# Patient Record
Sex: Female | Born: 1980 | Race: White | Hispanic: No | Marital: Single | Smoking: Current every day smoker
Health system: Southern US, Community
[De-identification: ages and names within clinical notes are randomized; demographics above are authoritative.]

## PROBLEM LIST (undated history)

## (undated) DIAGNOSIS — K219 Gastro-esophageal reflux disease without esophagitis: Secondary | ICD-10-CM

## (undated) DIAGNOSIS — O99350 Diseases of the nervous system complicating pregnancy, unspecified trimester: Secondary | ICD-10-CM

## (undated) DIAGNOSIS — G510 Bell's palsy: Secondary | ICD-10-CM

## (undated) DIAGNOSIS — F419 Anxiety disorder, unspecified: Secondary | ICD-10-CM

## (undated) HISTORY — PX: KNEE SURGERY: SHX244

## (undated) HISTORY — PX: TONSILLECTOMY: SUR1361

## (undated) HISTORY — PX: CHOLECYSTECTOMY: SHX55

---

## 2013-02-01 ENCOUNTER — Emergency Department (HOSPITAL_COMMUNITY)
Admission: EM | Admit: 2013-02-01 | Discharge: 2013-02-01 | Disposition: A | Discharge status: Home / Self Care | Payer: Medicaid Other | Attending: Emergency Medicine | Admitting: Emergency Medicine

## 2013-02-01 ENCOUNTER — Encounter (HOSPITAL_COMMUNITY): Payer: Self-pay | Admitting: *Deleted

## 2013-02-01 DIAGNOSIS — R209 Unspecified disturbances of skin sensation: Secondary | ICD-10-CM | POA: Insufficient documentation

## 2013-02-01 DIAGNOSIS — F419 Anxiety disorder, unspecified: Secondary | ICD-10-CM

## 2013-02-01 DIAGNOSIS — Z3202 Encounter for pregnancy test, result negative: Secondary | ICD-10-CM | POA: Insufficient documentation

## 2013-02-01 DIAGNOSIS — M79602 Pain in left arm: Secondary | ICD-10-CM

## 2013-02-01 DIAGNOSIS — F411 Generalized anxiety disorder: Secondary | ICD-10-CM | POA: Insufficient documentation

## 2013-02-01 DIAGNOSIS — M79605 Pain in left leg: Secondary | ICD-10-CM

## 2013-02-01 DIAGNOSIS — M79609 Pain in unspecified limb: Secondary | ICD-10-CM | POA: Insufficient documentation

## 2013-02-01 DIAGNOSIS — Z79899 Other long term (current) drug therapy: Secondary | ICD-10-CM | POA: Insufficient documentation

## 2013-02-01 DIAGNOSIS — R5381 Other malaise: Secondary | ICD-10-CM | POA: Insufficient documentation

## 2013-02-01 DIAGNOSIS — IMO0001 Reserved for inherently not codable concepts without codable children: Secondary | ICD-10-CM | POA: Insufficient documentation

## 2013-02-01 HISTORY — DX: Anxiety disorder, unspecified: F41.9

## 2013-02-01 LAB — POCT I-STAT, CHEM 8
Calcium, Ion: 1.22 mmol/L (ref 1.12–1.23)
Chloride: 106 mEq/L (ref 96–112)
HCT: 39 % (ref 36.0–46.0)
Potassium: 3.9 mEq/L (ref 3.5–5.1)
Sodium: 141 mEq/L (ref 135–145)

## 2013-02-01 LAB — PREGNANCY, URINE: Preg Test, Ur: NEGATIVE

## 2013-02-01 LAB — CK: Total CK: 79 U/L (ref 7–177)

## 2013-02-01 MED ORDER — CITALOPRAM HYDROBROMIDE 40 MG PO TABS: 40.0000 mg | ORAL_TABLET | Freq: Every day | ORAL | Status: DC

## 2013-02-01 MED ORDER — LORAZEPAM 1 MG PO TABS: 1.0000 mg | ORAL_TABLET | Freq: Three times a day (TID) | ORAL | Status: DC | PRN

## 2013-02-01 MED ORDER — LORAZEPAM 1 MG PO TABS
1.0000 mg | ORAL_TABLET | Freq: Once | ORAL | Status: AC
Start: 2013-02-01 — End: 2013-02-01
  Administered 2013-02-01: 1 mg via ORAL
  Filled 2013-02-01: qty 1

## 2013-02-01 NOTE — ED Notes (Signed)
Per report from [facility] pt states that 1300 she began to have L sided pain in her arm, leg, and torso.  No neuro deficits reported.  Pt is A/ox 3.  Resp symmetrical and unlabored.

## 2013-02-01 NOTE — ED Provider Notes (Signed)
History     CSN: [id number]  Arrival date & time 02/01/13  1402   First MD Initiated Contact with Patient 02/01/13 1407      Chief Complaint  Patient presents with  . Extremity Pain    Pt has generalized L sided pain in L arm, L leg, and L torso.    (Consider location/radiation/quality/duration/timing/severity/associated sxs/prior treatment) HPI Tracy Vance is a 32 y.o. female who presents to ED with complaint of left sided pain and "numb feeling."  States symptoms began while driving. States started in left arm, and now in left leg, left flank, left abdomen, left face. States "feels like my left side is about to fall asleep." States no hx of the same. No fever, chills malaise. NO chest pain. No SOB. No swelling of extremities. No injuries. No headache no neck pain. No fever. States does have hx of anxiety, ran out of celexa 2 wks ago. Not sure if pregnant.    Past Medical History  Diagnosis Date  . Anxiety     No past surgical history on file.  No family history on file.  History  Substance Use Topics  . Smoking status: Not on file  . Smokeless tobacco: Not on file  . Alcohol Use: Not on file    OB History   Grav Para Term Preterm Abortions TAB SAB Ect Mult Living                  Review of Systems  Constitutional: Negative for fever and chills.  HENT: Negative for neck pain and neck stiffness.   Respiratory: Negative.  Negative for chest tightness.   Cardiovascular: Negative.   Musculoskeletal: Positive for myalgias.  Skin: Negative.   Neurological: Positive for weakness and numbness. Negative for dizziness, light-headedness and headaches.    Allergies  Latex; Naproxen; and Toradol  Home Medications   Current Outpatient Rx  Name  Route  Sig  Dispense  Refill  . citalopram (CELEXA) 40 MG tablet   Oral   Take 40 mg by mouth daily.         Marland Kitchen ibuprofen (ADVIL,MOTRIN) 200 MG tablet   Oral   Take 800 mg by mouth every 6 (six) hours as needed for  pain.           BP 117/78  Pulse 84  Temp(Src) 98.9 F (37.2 C) (Oral)  Resp 14  SpO2 98%  Physical Exam  Nursing note and vitals reviewed. Constitutional: She is oriented to person, place, and time. She appears well-developed and well-nourished. No distress.  HENT:  Head: Normocephalic and atraumatic.  Eyes: Conjunctivae are normal.  Neck: Normal range of motion. Neck supple.  Cardiovascular: Normal rate, regular rhythm and normal heart sounds.   Pulmonary/Chest: Effort normal and breath sounds normal. No respiratory distress. She has no wheezes. She has no rales.  Abdominal: Soft. Bowel sounds are normal. She exhibits no distension. There is no tenderness. There is no rebound.  Musculoskeletal: Normal range of motion. She exhibits no edema and no tenderness.  Full rom of bilateral upper and lower extremitieis.   Neurological: She is alert and oriented to person, place, and time.  5/5 and equal upper and lower extremity strength bilaterally. Equal grip strength bilaterally. Normal finger to nose and heel to shin. No pronator drift.   Skin: Skin is warm and dry.  Psychiatric: She has a normal mood and affect. Her behavior is normal.    ED Course  Procedures (including critical care time)  Date: 02/01/2013  Rate: 83  Rhythm: normal sinus rhythm  QRS Axis: normal  Intervals: normal  ST/T Wave abnormalities: normal  Conduction Disutrbances:none  Narrative Interpretation:   Old EKG Reviewed: none available  Pt appears anxious. No exam findings. Full rom of all extremities. Pain with movement left arm and leg. Strength 5/5. No swelling. Doubt DVT. VS normal. Will get electrolytes, preg test, ecg.   Results for orders placed during the hospital encounter of 02/01/13  CK      Result Value Range   Total CK 79  7 - 177 U/L  PREGNANCY, URINE      Result Value Range   Preg Test, Ur NEGATIVE  NEGATIVE  POCT I-STAT, CHEM 8      Result Value Range   Sodium 141  135 - 145  mEq/L   Potassium 3.9  3.5 - 5.1 mEq/L   Chloride 106  96 - 112 mEq/L   BUN 16  6 - 23 mg/dL   Creatinine, Ser 1.32  0.50 - 1.10 mg/dL   Glucose, Bld 92  70 - 99 mg/dL   Calcium, Ion 4.40  1.02 - 1.23 mmol/L   TCO2 27  0 - 100 mmol/L   Hemoglobin 13.3  12.0 - 15.0 g/dL   HCT 72.5  36.6 - 44.0 %   No results found.    1. Left arm pain   2. Left leg pain   3. Anxiety       MDM  Pt with left sided pain. Pt came in very anxious. She has normal exam. Her symptoms have resolved. She is crying originally. After reassuring, pt states she is feeling much better. ATivan has helped her nerves and she is completely symptom free. Electrolytes negative.  CK obtained and is negative, obtained due to pain to r/o rhabdo. ECG normal. Possible panic attack as cause of pt's symptoms. Asked for celexa refill until she can find pcp. Will refill. Instructed to follow up closely.           Lottie Mussel, PA-C 02/02/13 1722

## 2013-02-06 NOTE — ED Provider Notes (Signed)
Medical screening examination/treatment/procedure(s) were performed by non-physician practitioner and as supervising physician I was immediately available for consultation/collaboration.   Rolan Bucco, MD 02/06/13 [phone]

## 2013-07-02 ENCOUNTER — Encounter (HOSPITAL_COMMUNITY): Payer: Self-pay | Admitting: Emergency Medicine

## 2013-07-02 ENCOUNTER — Emergency Department (HOSPITAL_COMMUNITY)
Admission: EM | Admit: 2013-07-02 | Discharge: 2013-07-02 | Disposition: A | Discharge status: Home / Self Care | Payer: Medicaid Other | Attending: Emergency Medicine | Admitting: Emergency Medicine

## 2013-07-02 DIAGNOSIS — K044 Acute apical periodontitis of pulpal origin: Secondary | ICD-10-CM | POA: Insufficient documentation

## 2013-07-02 DIAGNOSIS — F411 Generalized anxiety disorder: Secondary | ICD-10-CM | POA: Insufficient documentation

## 2013-07-02 DIAGNOSIS — K047 Periapical abscess without sinus: Secondary | ICD-10-CM

## 2013-07-02 DIAGNOSIS — Z91040 Latex allergy status: Secondary | ICD-10-CM | POA: Insufficient documentation

## 2013-07-02 DIAGNOSIS — K0889 Other specified disorders of teeth and supporting structures: Secondary | ICD-10-CM

## 2013-07-02 DIAGNOSIS — F17200 Nicotine dependence, unspecified, uncomplicated: Secondary | ICD-10-CM | POA: Insufficient documentation

## 2013-07-02 DIAGNOSIS — K089 Disorder of teeth and supporting structures, unspecified: Secondary | ICD-10-CM | POA: Insufficient documentation

## 2013-07-02 MED ORDER — HYDROCODONE-ACETAMINOPHEN 5-325 MG PO TABS: 1.0000 | ORAL_TABLET | ORAL | Status: DC | PRN

## 2013-07-02 MED ORDER — AMOXICILLIN 500 MG PO CAPS: 500.0000 mg | ORAL_CAPSULE | Freq: Three times a day (TID) | ORAL | Status: DC

## 2013-07-02 NOTE — ED Provider Notes (Signed)
CSN: [id number]     Arrival date & time 07/02/13  1242 History   First MD Initiated Contact with Patient 07/02/13 1247     Chief Complaint  Patient presents with  . Dental Pain   (Consider location/radiation/quality/duration/timing/severity/associated sxs/prior Treatment) HPI Comments: 32 year old female presents to the emergency department from the dentist office across the street after attempt for a tooth being pulled. Patient states the dentist was in the process of pulling her tooth, however she was not known in the dentist continue to try to pull the tooth with patient being in severe pain. Dentist then injected more numbing medicine, had patient sit for a little while, after 15 minutes the dentist not returning patient left very upset. Dentist's office was supposed to prescribe antibiotics for the infection of her tooth that was seen on x-ray taken earlier today before attempted extraction. Denies fever, chills, difficulty swallowing or breathing. This was the first time she had encountered this dentist.  Patient is a 33 y.o. female presenting with tooth pain. The history is provided by the patient.  Dental Pain   Past Medical History  Diagnosis Date  . Anxiety    Past Surgical History  Procedure Laterality Date  . Cesarean section    . Tonsillectomy    . Knee surgery     No family history on file. History  Substance Use Topics  . Smoking status: Current Every Day Smoker -- 1.00 packs/day for 15 years    Types: Cigarettes  . Smokeless tobacco: Never Used  . Alcohol Use: Yes     Comment: Occassionally    OB History   Grav Para Term Preterm Abortions TAB SAB Ect Mult Living                 Review of Systems  HENT: Positive for dental problem.   All other systems reviewed and are negative.    Allergies  Latex; Naproxen; and Toradol  Home Medications   Current Outpatient Rx  Name  Route  Sig  Dispense  Refill  . citalopram (CELEXA) 40 MG tablet   Oral   Take 1  tablet (40 mg total) by mouth daily.   30 tablet   1   . DiphenhydrAMINE HCl (BENADRYL ALLERGY PO)   Oral   Take 1 tablet by mouth daily as needed (allergies).         Marland Kitchen ibuprofen (ADVIL,MOTRIN) 200 MG tablet   Oral   Take 800 mg by mouth every 6 (six) hours as needed for pain.         Marland Kitchen amoxicillin (AMOXIL) 500 MG capsule   Oral   Take 1 capsule (500 mg total) by mouth 3 (three) times daily.   21 capsule   0   . HYDROcodone-acetaminophen (NORCO/VICODIN) 5-325 MG per tablet   Oral   Take 1-2 tablets by mouth every 4 (four) hours as needed for pain.   6 tablet   0    BP 159/99  Pulse 90  Temp(Src) 98.2 F (36.8 C) (Oral)  Resp 22  SpO2 98%  LMP 05/21/2013 Physical Exam  Nursing note and vitals reviewed. Constitutional: She is oriented to person, place, and time. She appears well-developed and well-nourished. No distress.  Tearful  HENT:  Head: Normocephalic and atraumatic.  Mouth/Throat: Oropharynx is clear and moist.    Eyes: Conjunctivae are normal.  Neck: Normal range of motion. Neck supple.  Cardiovascular: Normal rate, regular rhythm and normal heart sounds.   Pulmonary/Chest: Effort normal and  breath sounds normal.  Musculoskeletal: Normal range of motion. She exhibits no edema.  Lymphadenopathy:    She has no cervical adenopathy.  Neurological: She is alert and oriented to person, place, and time.  Skin: Skin is warm and dry. She is not diaphoretic.  Psychiatric: She has a normal mood and affect. Her behavior is normal.    ED Course  Procedures (including critical care time) Labs Review Labs Reviewed - No data to display Imaging Review No results found.  MDM   1. Pain, dental   2. Dental infection     Dental pain associated with dental infection. No evidence of dental abscess. Patient is afebrile, non toxic appearing and swallowing secretions well. I gave patient referral to dentist and stressed the importance of dental follow up for ultimate  management of dental pain. I will also give amoxicillin and pain control. Patient voices understanding and is agreeable to plan.   Trevor Mace, PA-C 07/02/13 1312

## 2013-07-02 NOTE — ED Provider Notes (Signed)
Medical screening examination/treatment/procedure(s) were conducted as a shared visit with non-physician practitioner(s) and myself.  I personally evaluated the patient during the encounter   Shandiin Eisenbeis, MD 07/02/13 1313 

## 2013-07-02 NOTE — ED Notes (Signed)
Pt reports coming from a dental office. Pt states the dentist was attempting to extract a left-anterior tooth, which has obvious decay. Pt states her face was numb due to local anesthetic, however the tooth was not numb and left the office due to unbearable pain. Pt is tearful in triage.

## 2013-08-22 ENCOUNTER — Emergency Department (HOSPITAL_COMMUNITY)
Admission: EM | Admit: 2013-08-22 | Discharge: 2013-08-23 | Disposition: A | Discharge status: Home / Self Care | Payer: Medicaid Other | Attending: Emergency Medicine | Admitting: Emergency Medicine

## 2013-08-22 ENCOUNTER — Emergency Department (HOSPITAL_COMMUNITY): Payer: Medicaid Other

## 2013-08-22 ENCOUNTER — Encounter (HOSPITAL_COMMUNITY): Payer: Self-pay | Admitting: Emergency Medicine

## 2013-08-22 DIAGNOSIS — Z91040 Latex allergy status: Secondary | ICD-10-CM | POA: Insufficient documentation

## 2013-08-22 DIAGNOSIS — N730 Acute parametritis and pelvic cellulitis: Secondary | ICD-10-CM | POA: Insufficient documentation

## 2013-08-22 DIAGNOSIS — Z3202 Encounter for pregnancy test, result negative: Secondary | ICD-10-CM | POA: Insufficient documentation

## 2013-08-22 DIAGNOSIS — Z792 Long term (current) use of antibiotics: Secondary | ICD-10-CM | POA: Insufficient documentation

## 2013-08-22 DIAGNOSIS — F17200 Nicotine dependence, unspecified, uncomplicated: Secondary | ICD-10-CM | POA: Insufficient documentation

## 2013-08-22 DIAGNOSIS — Z79899 Other long term (current) drug therapy: Secondary | ICD-10-CM | POA: Insufficient documentation

## 2013-08-22 DIAGNOSIS — R110 Nausea: Secondary | ICD-10-CM | POA: Insufficient documentation

## 2013-08-22 DIAGNOSIS — F411 Generalized anxiety disorder: Secondary | ICD-10-CM | POA: Insufficient documentation

## 2013-08-22 DIAGNOSIS — R42 Dizziness and giddiness: Secondary | ICD-10-CM | POA: Insufficient documentation

## 2013-08-22 LAB — URINALYSIS, ROUTINE W REFLEX MICROSCOPIC
Ketones, ur: NEGATIVE mg/dL
Leukocytes, UA: NEGATIVE
Nitrite: NEGATIVE
Protein, ur: NEGATIVE mg/dL
pH: 5.5 (ref 5.0–8.0)

## 2013-08-22 LAB — POCT I-STAT, CHEM 8
BUN: 17 mg/dL (ref 6–23)
Calcium, Ion: 1.17 mmol/L (ref 1.12–1.23)
Creatinine, Ser: 0.9 mg/dL (ref 0.50–1.10)
Glucose, Bld: 87 mg/dL (ref 70–99)
TCO2: 19 mmol/L (ref 0–100)

## 2013-08-22 LAB — CBC WITH DIFFERENTIAL/PLATELET
Basophils Absolute: 0.1 10*3/uL (ref 0.0–0.1)
Basophils Relative: 0 % (ref 0–1)
Hemoglobin: 14 g/dL (ref 12.0–15.0)
MCHC: 34.6 g/dL (ref 30.0–36.0)
Neutro Abs: 15.1 10*3/uL — ABNORMAL HIGH (ref 1.7–7.7)
Neutrophils Relative %: 74 % (ref 43–77)
RDW: 13.2 % (ref 11.5–15.5)
WBC: 20.4 10*3/uL — ABNORMAL HIGH (ref 4.0–10.5)

## 2013-08-22 LAB — WET PREP, GENITAL
Clue Cells Wet Prep HPF POC: NONE SEEN
Trich, Wet Prep: NONE SEEN

## 2013-08-22 MED ORDER — HYDROMORPHONE HCL PF 1 MG/ML IJ SOLN
1.0000 mg | Freq: Once | INTRAMUSCULAR | Status: AC
  Administered 2013-08-22: 1 mg via INTRAVENOUS
  Filled 2013-08-22: qty 1

## 2013-08-22 MED ORDER — ONDANSETRON HCL 4 MG PO TABS: 4.0000 mg | ORAL_TABLET | Freq: Four times a day (QID) | ORAL | Status: DC

## 2013-08-22 MED ORDER — CEFTRIAXONE SODIUM 250 MG IJ SOLR
250.0000 mg | Freq: Once | INTRAMUSCULAR | Status: AC
  Administered 2013-08-22: 250 mg via INTRAMUSCULAR
  Filled 2013-08-22: qty 250

## 2013-08-22 MED ORDER — LIDOCAINE HCL 1 % IJ SOLN
INTRAMUSCULAR | Status: AC
  Administered 2013-08-22: 0.9 mL
  Filled 2013-08-22: qty 20

## 2013-08-22 MED ORDER — IOHEXOL 300 MG/ML  SOLN
100.0000 mL | Freq: Once | INTRAMUSCULAR | Status: AC | PRN
  Administered 2013-08-22: 100 mL via INTRAVENOUS

## 2013-08-22 MED ORDER — ONDANSETRON HCL 4 MG/2ML IJ SOLN
4.0000 mg | Freq: Once | INTRAMUSCULAR | Status: AC
  Administered 2013-08-22: 4 mg via INTRAVENOUS
  Filled 2013-08-22: qty 2

## 2013-08-22 MED ORDER — IOHEXOL 300 MG/ML  SOLN
50.0000 mL | Freq: Once | INTRAMUSCULAR | Status: AC | PRN
  Administered 2013-08-22: 50 mL via ORAL

## 2013-08-22 MED ORDER — HYDROCODONE-ACETAMINOPHEN 5-325 MG PO TABS: 2.0000 | ORAL_TABLET | ORAL | Status: DC | PRN

## 2013-08-22 MED ORDER — SODIUM CHLORIDE 0.9 % IV BOLUS (SEPSIS)
1000.0000 mL | Freq: Once | INTRAVENOUS | Status: AC
  Administered 2013-08-22: 1000 mL via INTRAVENOUS

## 2013-08-22 MED ORDER — AZITHROMYCIN 250 MG PO TABS
1000.0000 mg | ORAL_TABLET | Freq: Once | ORAL | Status: AC
  Administered 2013-08-22: 1000 mg via ORAL
  Filled 2013-08-22: qty 4

## 2013-08-22 MED ORDER — DOXYCYCLINE HYCLATE 100 MG PO CAPS: 100.0000 mg | ORAL_CAPSULE | Freq: Two times a day (BID) | ORAL | Status: DC

## 2013-08-22 NOTE — ED Notes (Signed)
Bed: GN56 Expected date: 08/22/13 Expected time: 7:26 PM Means of arrival: Ambulance Comments: 32 yo F  menstrual cramps

## 2013-08-22 NOTE — ED Notes (Signed)
Attempted to do the in and out cath on the patient. Patient stated that she wanted pain medication before i could do the in and out because she cannot lay still.

## 2013-08-22 NOTE — ED Notes (Signed)
Patient states that she has had menstrual cramps rating 10 out of 10 since 3pm today. Patient took aleve today with no relief. Patient states that the pain radiates through to her back. She denies emesis, but she has had some nausea. She's been light-headed also.

## 2013-08-22 NOTE — ED Provider Notes (Signed)
CSN: [id number]     Arrival date & time 08/22/13  1937 History   First MD Initiated Contact with Patient 08/22/13 1946     Chief Complaint  Patient presents with  . Abdominal Cramping   (Consider location/radiation/quality/duration/timing/severity/associated sxs/prior Treatment) HPI  32 year old female with history of anxiety presents complaining of abdominal cramping. Patient reports he developed a gradual onset of low abnormal pain which started about 4 hours ago. Describe pain as a cramping sensation similar to her menstruation however much intense. Pain is nonradiating, 10 out of 10, worse when she sits down and improved when she walks around. She reports feeling nausea but without vomiting or diarrhea. Reports feeling lightheadedness without dizziness.  Her menstruation started today. States she has irregular period, currently on Depo shot. She took an Aleve today without any significant improvement. No complaints of fever, chills, chest pain, shortness of breath, productive cough, back pain, dysuria, hematuria, vaginal discharge, or rash. Patient is sexually active. She has been pregnant once and has one child. No recent trauma, no history of STD.  Past Medical History  Diagnosis Date  . Anxiety    Past Surgical History  Procedure Laterality Date  . Cesarean section    . Tonsillectomy    . Knee surgery     No family history on file. History  Substance Use Topics  . Smoking status: Current Every Day Smoker -- 1.00 packs/day for 15 years    Types: Cigarettes  . Smokeless tobacco: Never Used  . Alcohol Use: Yes     Comment: Occassionally    OB History   Grav Para Term Preterm Abortions TAB SAB Ect Mult Living                 Review of Systems  All other systems reviewed and are negative.    Allergies  Latex; Naproxen; and Toradol  Home Medications   Current Outpatient Rx  Name  Route  Sig  Dispense  Refill  . amoxicillin (AMOXIL) 500 MG capsule   Oral   Take 1  capsule (500 mg total) by mouth 3 (three) times daily.   21 capsule   0   . citalopram (CELEXA) 40 MG tablet   Oral   Take 1 tablet (40 mg total) by mouth daily.   30 tablet   1   . DiphenhydrAMINE HCl (BENADRYL ALLERGY PO)   Oral   Take 1 tablet by mouth daily as needed (allergies).         Marland Kitchen HYDROcodone-acetaminophen (NORCO/VICODIN) 5-325 MG per tablet   Oral   Take 1-2 tablets by mouth every 4 (four) hours as needed for pain.   6 tablet   0   . ibuprofen (ADVIL,MOTRIN) 200 MG tablet   Oral   Take 800 mg by mouth every 6 (six) hours as needed for pain.          BP 141/82  Pulse 81  Temp(Src) 98.6 F (37 C) (Oral)  Resp 18  SpO2 100% Physical Exam  Nursing note and vitals reviewed. Constitutional: She is oriented to person, place, and time. She appears well-developed and well-nourished. She appears distressed.  Patient is morbidly obese, pacing around the room appears to be very uncomfortable  HENT:  Head: Normocephalic and atraumatic.  Eyes: Conjunctivae are normal.  Neck: Normal range of motion. Neck supple.  Cardiovascular: Normal rate and regular rhythm.   Pulmonary/Chest: Effort normal and breath sounds normal. She exhibits no tenderness.  Abdominal: Soft. There is tenderness (Suprapubic  tenderness on palpation without guarding or rebound tenderness. No Murphy sign, no McBurney's point. Exam is difficult due to large body habitus.).  Genitourinary: Vagina normal and uterus normal. There is no rash or lesion on the right labia. There is no rash or lesion on the left labia. Cervix exhibits no motion tenderness and no discharge. Right adnexum displays no mass and no tenderness. Left adnexum displays no mass and no tenderness. No erythema, tenderness or bleeding around the vagina. No vaginal discharge found.  Chaperone present  No CVA tenderness  Exam difficult due to large body habitus.  Pt has blood in vaginal vault, cervical os closed.  No adnexal tenderness.   Difficult to assess for CMT but does have suprapupic tenderness.    Lymphadenopathy:       Right: No inguinal adenopathy present.       Left: No inguinal adenopathy present.  Neurological: She is alert and oriented to person, place, and time.  Skin: No rash noted.  Psychiatric: She has a normal mood and affect.    ED Course  Procedures (including critical care time)  8:08 PM Patient here with dysmenorrhea. She is afebrile with stable normal vital signs. She is pacing around the room and appears to be very comfortable. Pain medication given, workup initiated. No prior history of kidney stones and patient has no CVA tenderness to suggest kidney stone.  9:20 PM Pt continues to endorse pain.  Pelvic exam with suprapubic tenderness on palpation but no obvious adnexal tenderness.  Currently menstruating.  Since pt has a leukocytosis of 20.4 and having lower abd pain, will obtain abd/pelvis CT to r/o appy.  Care discussed with attending.    11:00 PM Patient reports feeling better with abdominal pain after receiving pain medication. Will obtain CT scan for further evaluation.  11:10 PM CT of abdomen and pelvis shows a normal appendix. Cervix distended which may be due to infection or retained secretions.  Since pt has tenderness on pelvic exam and has leukocytosis, Will treat for possible STD with rocephin/zithromax combo. Culture sent.  Will treat for PID with doxy 100mg  BID x 10 days.    Labs Review Labs Reviewed  WET PREP, GENITAL - Abnormal; Notable for the following:    WBC, Wet Prep HPF POC FEW (*)    All other components within normal limits  URINALYSIS, ROUTINE W REFLEX MICROSCOPIC - Abnormal; Notable for the following:    APPearance CLOUDY (*)    Specific Gravity, Urine 1.037 (*)    All other components within normal limits  CBC WITH DIFFERENTIAL - Abnormal; Notable for the following:    WBC 20.4 (*)    RBC 5.14 (*)    Neutro Abs 15.1 (*)    All other components within normal  limits  GC/CHLAMYDIA PROBE AMP  POCT I-STAT, CHEM 8  POCT PREGNANCY, URINE   Imaging Review Ct Abdomen Pelvis W Contrast  08/22/2013   CLINICAL DATA:  Rule out appendicitis or tubo-ovarian abscess. Negative pregnancy test  EXAM: CT ABDOMEN AND PELVIS WITH CONTRAST  TECHNIQUE: Multidetector CT imaging of the abdomen and pelvis was performed using the standard protocol following bolus administration of intravenous contrast.  CONTRAST:  OMNIPAQUE IOHEXOL 300 MG/ML  SOLN  COMPARISON:  None.  FINDINGS: Lung bases are clear.  Liver and bile ducts are normal. Gallbladder is surgically absent. Pancreas spleen and kidneys are normal.  Negative for bowel obstruction or bowel thickening. Appendix is normal. Mild sigmoid diverticulosis.  Both ovaries are normal. No  free fluid. The lower uterine segment and the cervix are distended and fluid filled. This may be due to scarring and retained secretions. Endometritis also possible. No free fluid or abscess is identified.  IMPRESSION: Normal appendix.  Distended and the cervix which may be due to infection or retained secretions. Question cervical scarring.   Electronically Signed   By: Marlan Palau M.D.   On: 08/22/2013 22:46    EKG Interpretation   None       MDM   1. PID (acute pelvic inflammatory disease)    BP 141/82  Pulse 81  Temp(Src) 98.6 F (37 C) (Oral)  Resp 18  SpO2 100%  LMP 08/22/2013  I have reviewed nursing notes and vital signs. I personally reviewed the imaging tests through PACS system  I reviewed available ER/hospitalization records thought the EMR     Fayrene Helper, [place] 08/22/13 2338

## 2013-10-06 ENCOUNTER — Encounter (HOSPITAL_COMMUNITY): Payer: Self-pay | Admitting: Pharmacist

## 2013-10-07 NOTE — H&P (Signed)
HISTORY AND PHYSICAL  Tracy Vance is a 32 y.o. female patient with CC: painful decayed teeth  No diagnosis found.  Past Medical History  Diagnosis Date  . Anxiety     No current facility-administered medications for this encounter.   Current Outpatient Prescriptions  Medication Sig Dispense Refill  . citalopram (CELEXA) 40 MG tablet Take 1 tablet (40 mg total) by mouth daily.  30 tablet  1  . medroxyPROGESTERone (DEPO-PROVERA) 150 MG/ML injection Inject 150 mg into the muscle every 3 (three) months.        Allergies  Allergen Reactions  . Latex Hives  . Naproxen Other (See Comments)    Increased BP, breathing difficulty  . Toradol [Ketorolac Tromethamine]     Increased BP, breathing difficulty   Active Problems:   * No active hospital problems. *  Vitals: There were no vitals taken for this visit. Lab results:No results found for this or any previous visit (from the past 24 hour(s)). Radiology Results: No results found. General appearance: alert, cooperative and morbidly obese Head: Normocephalic, without obvious abnormality, atraumatic Eyes: negative Nose: Nares normal. Septum midline. Mucosa normal. No drainage or sinus tenderness. Throat: dental caries teeth #'s 2, 3, 4, 5, 6, 7, 8, 9, 10, 11, 18, 32 Resp: clear to auscultation bilaterally Cardio: regular rate and rhythm, S1, S2 normal, no murmur, click, rub or gallop  Assessment: non-restorable teeth 2, 3, 4, 5, 6, 7, 8, 9, 10, 11, 18, 32.  Plan:Extract  teeth 2, 3, 4, 5, 6, 7, 8, 9, 10, 11, 18, 32.Alveoloplasty. General anesthesia. Day surgery.   Georgia Lopes 10/07/2013

## 2013-10-08 ENCOUNTER — Encounter (HOSPITAL_COMMUNITY): Payer: Self-pay | Admitting: Surgery

## 2013-10-10 MED ORDER — CEFAZOLIN SODIUM-DEXTROSE 2-3 GM-% IV SOLR
2.0000 g | INTRAVENOUS | Status: AC
  Administered 2013-10-11: 2 g via INTRAVENOUS

## 2013-10-11 ENCOUNTER — Ambulatory Visit (HOSPITAL_COMMUNITY): Payer: Medicaid Other | Admitting: Anesthesiology

## 2013-10-11 ENCOUNTER — Encounter (HOSPITAL_COMMUNITY): Payer: Self-pay | Admitting: Certified Registered Nurse Anesthetist

## 2013-10-11 ENCOUNTER — Encounter (HOSPITAL_COMMUNITY)
Admission: RE | Disposition: A | Discharge status: Home / Self Care | Payer: Self-pay | Source: Ambulatory Visit | Attending: Oral Surgery

## 2013-10-11 ENCOUNTER — Ambulatory Visit (HOSPITAL_COMMUNITY)
Admission: RE | Admit: 2013-10-11 | Discharge: 2013-10-11 | Disposition: A | Discharge status: Home / Self Care | Payer: Medicaid Other | Source: Ambulatory Visit | Attending: Oral Surgery | Admitting: Oral Surgery

## 2013-10-11 ENCOUNTER — Encounter (HOSPITAL_COMMUNITY): Payer: Medicaid Other | Admitting: Anesthesiology

## 2013-10-11 DIAGNOSIS — K029 Dental caries, unspecified: Secondary | ICD-10-CM | POA: Insufficient documentation

## 2013-10-11 DIAGNOSIS — F17200 Nicotine dependence, unspecified, uncomplicated: Secondary | ICD-10-CM | POA: Insufficient documentation

## 2013-10-11 DIAGNOSIS — K219 Gastro-esophageal reflux disease without esophagitis: Secondary | ICD-10-CM | POA: Insufficient documentation

## 2013-10-11 DIAGNOSIS — F411 Generalized anxiety disorder: Secondary | ICD-10-CM | POA: Insufficient documentation

## 2013-10-11 DIAGNOSIS — K0530 Chronic periodontitis, unspecified: Secondary | ICD-10-CM

## 2013-10-11 HISTORY — DX: Gastro-esophageal reflux disease without esophagitis: K21.9

## 2013-10-11 HISTORY — PX: MULTIPLE EXTRACTIONS WITH ALVEOLOPLASTY: SHX5342

## 2013-10-11 SURGERY — MULTIPLE EXTRACTION WITH ALVEOLOPLASTY
Anesthesia: General | Site: Mouth

## 2013-10-11 MED ORDER — HYDROMORPHONE HCL PF 1 MG/ML IJ SOLN
0.2500 mg | INTRAMUSCULAR | Status: DC | PRN
  Administered 2013-10-11 (×4): 0.5 mg via INTRAVENOUS

## 2013-10-11 MED ORDER — LIDOCAINE-EPINEPHRINE 2 %-1:100000 IJ SOLN
INTRAMUSCULAR | Status: AC
  Filled 2013-10-11: qty 1

## 2013-10-11 MED ORDER — ROCURONIUM BROMIDE 100 MG/10ML IV SOLN
INTRAVENOUS | Status: DC | PRN
  Administered 2013-10-11: 50 mg via INTRAVENOUS

## 2013-10-11 MED ORDER — ARTIFICIAL TEARS OP OINT
TOPICAL_OINTMENT | OPHTHALMIC | Status: DC | PRN
  Administered 2013-10-11: 1 via OPHTHALMIC

## 2013-10-11 MED ORDER — OXYMETAZOLINE HCL 0.05 % NA SOLN
NASAL | Status: DC | PRN
  Administered 2013-10-11: 4 via NASAL

## 2013-10-11 MED ORDER — LACTATED RINGERS IV SOLN
INTRAVENOUS | Status: DC | PRN
  Administered 2013-10-11 (×2): via INTRAVENOUS

## 2013-10-11 MED ORDER — ACETAMINOPHEN 10 MG/ML IV SOLN: 1000.0000 mg | Freq: Once | INTRAVENOUS | Status: DC

## 2013-10-11 MED ORDER — 0.9 % SODIUM CHLORIDE (POUR BTL) OPTIME
TOPICAL | Status: DC | PRN
  Administered 2013-10-11: 1000 mL

## 2013-10-11 MED ORDER — FENTANYL CITRATE 0.05 MG/ML IJ SOLN
INTRAMUSCULAR | Status: DC | PRN
  Administered 2013-10-11: 150 ug via INTRAVENOUS
  Administered 2013-10-11: 100 ug via INTRAVENOUS

## 2013-10-11 MED ORDER — OXYCODONE HCL 5 MG/5ML PO SOLN: 5.0000 mg | Freq: Once | ORAL | Status: DC | PRN

## 2013-10-11 MED ORDER — DEXAMETHASONE SODIUM PHOSPHATE 4 MG/ML IJ SOLN
INTRAMUSCULAR | Status: DC | PRN
  Administered 2013-10-11: 8 mg via INTRAVENOUS

## 2013-10-11 MED ORDER — PROPOFOL 10 MG/ML IV BOLUS
INTRAVENOUS | Status: DC | PRN
  Administered 2013-10-11: 170 mg via INTRAVENOUS

## 2013-10-11 MED ORDER — ACETAMINOPHEN 10 MG/ML IV SOLN
INTRAVENOUS | Status: AC
  Administered 2013-10-11: 1000 mg
  Filled 2013-10-11: qty 100

## 2013-10-11 MED ORDER — MIDAZOLAM HCL 5 MG/5ML IJ SOLN
INTRAMUSCULAR | Status: DC | PRN
  Administered 2013-10-11: 2 mg via INTRAVENOUS

## 2013-10-11 MED ORDER — HYDROMORPHONE HCL PF 1 MG/ML IJ SOLN
INTRAMUSCULAR | Status: AC
  Administered 2013-10-11: 0.5 mg via INTRAVENOUS
  Filled 2013-10-11: qty 1

## 2013-10-11 MED ORDER — ONDANSETRON HCL 4 MG/2ML IJ SOLN
INTRAMUSCULAR | Status: DC | PRN
  Administered 2013-10-11: 4 mg via INTRAVENOUS

## 2013-10-11 MED ORDER — ONDANSETRON HCL 4 MG/2ML IJ SOLN: 4.0000 mg | Freq: Once | INTRAMUSCULAR | Status: DC | PRN

## 2013-10-11 MED ORDER — GLYCOPYRROLATE 0.2 MG/ML IJ SOLN
INTRAMUSCULAR | Status: DC | PRN
  Administered 2013-10-11: 0.6 mg via INTRAVENOUS

## 2013-10-11 MED ORDER — OXYMETAZOLINE HCL 0.05 % NA SOLN
NASAL | Status: AC
  Filled 2013-10-11: qty 15

## 2013-10-11 MED ORDER — OXYCODONE-ACETAMINOPHEN 5-325 MG PO TABS: 1.0000 | ORAL_TABLET | ORAL | Status: DC | PRN

## 2013-10-11 MED ORDER — SODIUM CHLORIDE 0.9 % IR SOLN
Status: DC | PRN
  Administered 2013-10-11: 1000 mL

## 2013-10-11 MED ORDER — OXYCODONE HCL 5 MG PO TABS: 5.0000 mg | ORAL_TABLET | Freq: Once | ORAL | Status: DC | PRN

## 2013-10-11 MED ORDER — NEOSTIGMINE METHYLSULFATE 1 MG/ML IJ SOLN
INTRAMUSCULAR | Status: DC | PRN
  Administered 2013-10-11: 5 mg via INTRAVENOUS

## 2013-10-11 MED ORDER — LIDOCAINE HCL (CARDIAC) 20 MG/ML IV SOLN
INTRAVENOUS | Status: DC | PRN
  Administered 2013-10-11: 100 mg via INTRAVENOUS

## 2013-10-11 MED ORDER — LIDOCAINE-EPINEPHRINE 2 %-1:100000 IJ SOLN
INTRAMUSCULAR | Status: DC | PRN
  Administered 2013-10-11: 14 mL

## 2013-10-11 SURGICAL SUPPLY — 29 items
BUR CROSS CUT FISSURE 1.6 (BURR) ×2 IMPLANT
BUR EGG ELITE 4.0 (BURR) ×2 IMPLANT
CANISTER SUCTION 2500CC (MISCELLANEOUS) ×2 IMPLANT
CLOTH BEACON ORANGE TIMEOUT ST (SAFETY) IMPLANT
COVER SURGICAL LIGHT HANDLE (MISCELLANEOUS) ×2 IMPLANT
CRADLE DONUT ADULT HEAD (MISCELLANEOUS) ×2 IMPLANT
DECANTER SPIKE VIAL GLASS SM (MISCELLANEOUS) IMPLANT
GAUZE PACKING FOLDED 2  STR (GAUZE/BANDAGES/DRESSINGS) ×1
GAUZE PACKING FOLDED 2 STR (GAUZE/BANDAGES/DRESSINGS) ×1 IMPLANT
GLOVE BIO SURGEON STRL SZ 6.5 (GLOVE) IMPLANT
GLOVE BIO SURGEON STRL SZ7.5 (GLOVE) IMPLANT
GLOVE BIOGEL PI IND STRL 7.0 (GLOVE) ×1 IMPLANT
GLOVE BIOGEL PI INDICATOR 7.0 (GLOVE) ×1
GLOVE SURG SS PI 6.5 STRL IVOR (GLOVE) ×2 IMPLANT
GLOVE SURG SS PI 7.5 STRL IVOR (GLOVE) ×2 IMPLANT
GOWN STRL NON-REIN LRG LVL3 (GOWN DISPOSABLE) ×2 IMPLANT
GOWN STRL REIN XL XLG (GOWN DISPOSABLE) ×2 IMPLANT
KIT BASIN OR (CUSTOM PROCEDURE TRAY) ×2 IMPLANT
KIT ROOM TURNOVER OR (KITS) ×2 IMPLANT
NEEDLE 22X1 1/2 (OR ONLY) (NEEDLE) ×2 IMPLANT
NS IRRIG 1000ML POUR BTL (IV SOLUTION) ×2 IMPLANT
PAD ARMBOARD 7.5X6 YLW CONV (MISCELLANEOUS) ×4 IMPLANT
SUT CHROMIC 3 0 PS 2 (SUTURE) ×4 IMPLANT
SYR CONTROL 10ML LL (SYRINGE) ×2 IMPLANT
TOWEL OR 17X26 10 PK STRL BLUE (TOWEL DISPOSABLE) ×2 IMPLANT
TRAY ENT MC OR (CUSTOM PROCEDURE TRAY) ×2 IMPLANT
TUBING IRRIGATION (MISCELLANEOUS) ×2 IMPLANT
WATER STERILE IRR 1000ML POUR (IV SOLUTION) IMPLANT
YANKAUER SUCT BULB TIP NO VENT (SUCTIONS) ×2 IMPLANT

## 2013-10-11 NOTE — H&P (Signed)
H&P documentation  -History and Physical Reviewed  -Patient has been re-examined  -No change in the plan of care  Tracy Vance M  

## 2013-10-11 NOTE — Preoperative (Signed)
Beta Blockers   Reason not to administer Beta Blockers:Not Applicable 

## 2013-10-11 NOTE — Anesthesia Preprocedure Evaluation (Addendum)
Anesthesia Evaluation  Patient identified by MRN, date of birth, ID band Patient awake    Reviewed: Allergy & Precautions, H&P , NPO status , Patient's Chart, lab work & pertinent test results  History of Anesthesia Complications Negative for: history of anesthetic complications  Airway Mallampati: II TM Distance: >3 FB Neck ROM: Full    Dental  (+) Poor Dentition and Dental Advisory Given   Pulmonary Current Smoker,  breath sounds clear to auscultation        Cardiovascular negative cardio ROS  Rhythm:Regular     Neuro/Psych Anxiety negative neurological ROS     GI/Hepatic Neg liver ROS, GERD-  Controlled and Medicated,  Endo/Other  Morbid obesity  Renal/GU negative Renal ROS  negative genitourinary   Musculoskeletal   Abdominal   Peds  Hematology negative hematology ROS (+)   Anesthesia Other Findings   Reproductive/Obstetrics negative OB ROS                         Anesthesia Physical Anesthesia Plan  ASA: II  Anesthesia Plan: General   Post-op Pain Management:    Induction:   Airway Management Planned: Nasal ETT  Additional Equipment:   Intra-op Plan:   Post-operative Plan: Extubation in OR  Informed Consent: I have reviewed the patients History and Physical, chart, labs and discussed the procedure including the risks, benefits and alternatives for the proposed anesthesia with the patient or authorized representative who has indicated his/her understanding and acceptance.   Dental advisory given  Plan Discussed with: CRNA and Anesthesiologist  Anesthesia Plan Comments:         Anesthesia Quick Evaluation

## 2013-10-11 NOTE — Transfer of Care (Signed)
Immediate Anesthesia Transfer of Care Note  Patient: Tracy Vance  Procedure(s) Performed: Procedure(s): MULTIPLE EXTRACTION WITH ALVEOLOPLASTY (N/A)  Patient Location: PACU  Anesthesia Type:General  Level of Consciousness: awake, alert , oriented and patient cooperative  Airway & Oxygen Therapy: Patient Spontanous Breathing and Patient connected to face mask oxygen  Post-op Assessment: Report given to PACU RN and Post -op Vital signs reviewed and stable  Post vital signs: Reviewed and stable  Complications: No apparent anesthesia complications

## 2013-10-11 NOTE — Op Note (Signed)
10/11/2013  8:03 AM  PATIENT:  Tracy Vance  32 y.o. female  PRE-OPERATIVE DIAGNOSIS:  NON RESTORABLE TEETH #'s 2, 3, 4, 5, 6, 7, 8, 9, 10, 11, 18, 32  POST-OPERATIVE DIAGNOSIS:  SAME  PROCEDURE:  Procedure(s): MULTIPLE EXTRACTIONTEETH #'s 2, 3, 4, 5, 6, 7, 8, 9, 10, 11, 18, 32 WITH ALVEOLOPLASTY  SURGEON:  Surgeon(s): Georgia Lopes, DDS  ANESTHESIA:   local and general  EBL:  minimal  DRAINS: none   SPECIMEN:  No Specimen  COUNTS:  YES  PLAN OF CARE: Discharge to home after PACU  PATIENT DISPOSITION:  PACU - hemodynamically stable.   PROCEDURE DETAILS: Dictation # [id number]  Georgia Lopes, DMD 10/11/2013 8:03 AM

## 2013-10-11 NOTE — Anesthesia Postprocedure Evaluation (Signed)
  Anesthesia Post-op Note  Patient: Tracy Vance  Procedure(s) Performed: Procedure(s): MULTIPLE EXTRACTION WITH ALVEOLOPLASTY (N/A)  Patient Location: PACU  Anesthesia Type:General  Level of Consciousness: awake, alert  and oriented  Airway and Oxygen Therapy: Patient Spontanous Breathing  Post-op Pain: mild  Post-op Assessment: Post-op Vital signs reviewed, Patient's Cardiovascular Status Stable, Respiratory Function Stable, Patent Airway and Pain level controlled  Post-op Vital Signs: stable  Complications: No apparent anesthesia complications

## 2013-10-12 ENCOUNTER — Encounter (HOSPITAL_COMMUNITY): Payer: Self-pay | Admitting: Oral Surgery

## 2013-10-12 NOTE — Op Note (Signed)
Tracy Vance, Tracy Vance             ACCOUNT NO.:  [id number]  MEDICAL RECORD NO.:  [id number]  LOCATION:  [facility]                         FACILITY:  [facility]  PHYSICIAN:  Georgia Lopes, M.D.  DATE OF BIRTH:  [birth year]  DATE OF PROCEDURE:  10/11/2013 DATE OF DISCHARGE:  10/11/2013                              OPERATIVE REPORT   PREOPERATIVE DIAGNOSIS:  Nonrestorable teeth numbers 2, 3, 4, 5, 6, 7, 8, 9, 10, 11, 18, and 32.  POSTOPERATIVE DIAGNOSIS:  Nonrestorable teeth numbers 2, 3, 4, 5, 6, 7, 8, 9, 10, 11, 18, and 32.  PROCEDURE:  Extraction of teeth numbers 2, 3, 4, 5, 6, 7, 8, 9, 10, 11, 18, and 32, alveoloplasty.  SURGEON:  Georgia Lopes, MD  ANESTHESIA:  General, nasal intubation, Dr. Laural Benes attending.  INDICATIONS FOR PROCEDURE:  Tracy Vance is a 32 year old female, who is referred to me by her general dentist for removal of all remaining upper teeth and nonrestorable lower teeth.  PAST MEDICAL HISTORY:  Significant for morbid obesity, anxiety, allergy to latex because of the need for adequate anesthesia and airway protection was recommended that the patient undergo surgery at [facility] where she can be intubated.  PROCEDURE IN DETAIL:  The patient was taken to the operating room, placed on the table in supine position.  General anesthesia was administered intravenously and nasal endotracheal tube was placed and secured.  The eyes were protected.  The patient was draped for the procedure.  Time-out was performed.  The posterior pharynx was suctioned.  The throat pack was placed.  A 2% lidocaine with 1:100,000 epinephrine was infiltrated in an inferior alveolar block on the right and left side and in buccal and palatal infiltration of the maxilla. Total of 14 mL was utilized. A bite block and sweetheart retractor were placed in the right side of the mouth and the left side was operated first.  A 15 blade was used to make an incision around tooth #18 in the mandible and around  teeth numbers 11, 10, 9, 8, 7, 6 in the maxilla. The periosteum was reflected around these teeth.  The lower tooth was removed using the 301 elevator and a #23 forceps.  The maxillary teeth were removed by using a 301 elevator and the #150 forceps.  The sockets were then curetted and then in the maxilla, a small incision was made proximal to tooth #11 and the periosteum was reflected to expose the alveolar crest.  Then, the egg-shaped bur and bone file were used to perform an alveoplasty in the left maxilla.  Then the areas were irrigated and closed with 3-0 chromic.  The sweetheart retractor and bite block were repositioned to the other side of the mouth and the right side was operated.  A 15 blade was used to make an incision around tooth #32 in the mandible and around tooth numbers 2, 3, 4, 5, and 6 in the maxilla.  The periosteum was reflected.  The teeth were elevated and removed with the 301 elevator and dental forceps.  Then, the sockets were irrigated, curetted, and then the periosteum was further reflected to expose the right maxillary buccal crest and this was smoothed with the egg-shaped  bur and bone file.  The area was irrigated and closed with 3-0 chromic.  The oral cavity was then inspected and irrigated and suctioned.  Then the throat pack was removed.  The patient was awakened, taken to the recovery room, breathing spontaneously in good condition.  ESTIMATED BLOOD LOSS:  Minimum.  COMPLICATIONS:  None.  SPECIMENS:  None.     Georgia Lopes, M.D.     SMJ/MEDQ  D:  10/11/2013  T:  10/12/2013  Job:  [id number]

## 2013-12-21 ENCOUNTER — Emergency Department (HOSPITAL_BASED_OUTPATIENT_CLINIC_OR_DEPARTMENT_OTHER): Payer: Medicaid Other

## 2013-12-21 ENCOUNTER — Encounter (HOSPITAL_BASED_OUTPATIENT_CLINIC_OR_DEPARTMENT_OTHER): Payer: Self-pay | Admitting: Emergency Medicine

## 2013-12-21 ENCOUNTER — Emergency Department (HOSPITAL_BASED_OUTPATIENT_CLINIC_OR_DEPARTMENT_OTHER)
Admission: EM | Admit: 2013-12-21 | Discharge: 2013-12-21 | Disposition: A | Discharge status: Home / Self Care | Payer: Medicaid Other | Attending: Emergency Medicine | Admitting: Emergency Medicine

## 2013-12-21 ENCOUNTER — Other Ambulatory Visit: Payer: Self-pay

## 2013-12-21 DIAGNOSIS — Z91040 Latex allergy status: Secondary | ICD-10-CM | POA: Insufficient documentation

## 2013-12-21 DIAGNOSIS — F17200 Nicotine dependence, unspecified, uncomplicated: Secondary | ICD-10-CM | POA: Insufficient documentation

## 2013-12-21 DIAGNOSIS — F411 Generalized anxiety disorder: Secondary | ICD-10-CM | POA: Insufficient documentation

## 2013-12-21 DIAGNOSIS — Z79899 Other long term (current) drug therapy: Secondary | ICD-10-CM | POA: Insufficient documentation

## 2013-12-21 DIAGNOSIS — Z8719 Personal history of other diseases of the digestive system: Secondary | ICD-10-CM | POA: Insufficient documentation

## 2013-12-21 DIAGNOSIS — R071 Chest pain on breathing: Secondary | ICD-10-CM | POA: Insufficient documentation

## 2013-12-21 DIAGNOSIS — R0789 Other chest pain: Secondary | ICD-10-CM

## 2013-12-21 HISTORY — DX: Morbid (severe) obesity due to excess calories: E66.01

## 2013-12-21 LAB — CBC WITH DIFFERENTIAL/PLATELET
BASOS PCT: 0 % (ref 0–1)
Basophils Absolute: 0 10*3/uL (ref 0.0–0.1)
EOS ABS: 0.5 10*3/uL (ref 0.0–0.7)
Eosinophils Relative: 3 % (ref 0–5)
HCT: 39.3 % (ref 36.0–46.0)
Hemoglobin: 12.9 g/dL (ref 12.0–15.0)
Lymphocytes Relative: 24 % (ref 12–46)
Lymphs Abs: 3.2 10*3/uL (ref 0.7–4.0)
MCH: 26.5 pg (ref 26.0–34.0)
MCHC: 32.8 g/dL (ref 30.0–36.0)
MCV: 80.7 fL (ref 78.0–100.0)
Monocytes Absolute: 0.7 10*3/uL (ref 0.1–1.0)
Monocytes Relative: 5 % (ref 3–12)
NEUTROS PCT: 68 % (ref 43–77)
Neutro Abs: 9.1 10*3/uL — ABNORMAL HIGH (ref 1.7–7.7)
PLATELETS: 372 10*3/uL (ref 150–400)
RBC: 4.87 MIL/uL (ref 3.87–5.11)
RDW: 13.7 % (ref 11.5–15.5)
WBC: 13.5 10*3/uL — ABNORMAL HIGH (ref 4.0–10.5)

## 2013-12-21 LAB — COMPREHENSIVE METABOLIC PANEL
ALT: 17 U/L (ref 0–35)
AST: 18 U/L (ref 0–37)
Albumin: 3.7 g/dL (ref 3.5–5.2)
Alkaline Phosphatase: 73 U/L (ref 39–117)
BILIRUBIN TOTAL: 0.2 mg/dL — AB (ref 0.3–1.2)
BUN: 13 mg/dL (ref 6–23)
CO2: 24 meq/L (ref 19–32)
CREATININE: 0.8 mg/dL (ref 0.50–1.10)
Calcium: 9.4 mg/dL (ref 8.4–10.5)
Chloride: 102 mEq/L (ref 96–112)
GFR calc Af Amer: >90 mL/min (ref >90)
Glucose, Bld: 83 mg/dL (ref 70–99)
Potassium: 4.3 mEq/L (ref 3.7–5.3)
Sodium: 139 mEq/L (ref 137–147)
Total Protein: 7.2 g/dL (ref 6.0–8.3)

## 2013-12-21 LAB — D-DIMER, QUANTITATIVE (NOT AT ARMC): D DIMER QUANT: 0.33 ug{FEU}/mL (ref 0.00–0.48)

## 2013-12-21 LAB — TROPONIN I: Troponin I: <0.3 ng/mL (ref <0.30)

## 2013-12-21 MED ORDER — ASPIRIN 81 MG PO CHEW
324.0000 mg | CHEWABLE_TABLET | Freq: Once | ORAL | Status: AC
  Administered 2013-12-21: 324 mg via ORAL
  Filled 2013-12-21: qty 4

## 2013-12-21 MED ORDER — OXYCODONE-ACETAMINOPHEN 5-325 MG PO TABS
2.0000 | ORAL_TABLET | Freq: Once | ORAL | Status: AC
Start: 2013-12-21 — End: 2013-12-21
  Administered 2013-12-21: 2 via ORAL
  Filled 2013-12-21: qty 2

## 2013-12-21 NOTE — Discharge Instructions (Signed)
Chest Wall Pain Chest wall pain is pain felt in or around the chest bones and muscles. It may take up to 6 weeks to get better. It may take longer if you are active. Chest wall pain can happen on its own. Other times, things like germs, injury, coughing, or exercise can cause the pain. HOME CARE   Avoid activities that make you tired or cause pain. Try not to use your chest, belly (abdominal), or side muscles. Do not use heavy weights.  Put ice on the sore area.  Put ice in a plastic bag.  Place a towel between your skin and the bag.  Leave the ice on for 15-20 minutes for the first 2 days.  Only take medicine as told by your doctor. GET HELP RIGHT AWAY IF:   You have more pain or are very uncomfortable.  You have a fever.  Your chest pain gets worse.  You have new problems.  You feel sick to your stomach (nauseous) or throw up (vomit).  You start to sweat or feel lightheaded.  You have a cough with mucus (phlegm).  You cough up blood. MAKE SURE YOU:   Understand these instructions.  Will watch your condition.  Will get help right away if you are not doing well or get worse. Document Released: 04/08/2008 Document Revised: 01/13/2012 Document Reviewed: 06/17/2011 [facility] Patient Information 2014 [place], [place].  Chest Pain (Nonspecific) It is often hard to give a specific diagnosis for the cause of chest pain. There is always a chance that your pain could be related to something serious, such as a heart attack or a blood clot in the lungs. You need to follow up with your caregiver for further evaluation. CAUSES   Heartburn.  Pneumonia or bronchitis.  Anxiety or stress.  Inflammation around your heart (pericarditis) or lung (pleuritis or pleurisy).  A blood clot in the lung.  A collapsed lung (pneumothorax). It can develop suddenly on its own (spontaneous pneumothorax) or from injury (trauma) to the chest.  Shingles infection (herpes zoster virus). The chest  wall is composed of bones, muscles, and cartilage. Any of these can be the source of the pain.  The bones can be bruised by injury.  The muscles or cartilage can be strained by coughing or overwork.  The cartilage can be affected by inflammation and become sore (costochondritis). DIAGNOSIS  Lab tests or other studies, such as X-rays, electrocardiography, stress testing, or cardiac imaging, may be needed to find the cause of your pain.  TREATMENT   Treatment depends on what may be causing your chest pain. Treatment may include:  Acid blockers for heartburn.  Anti-inflammatory medicine.  Pain medicine for inflammatory conditions.  Antibiotics if an infection is present.  You may be advised to change lifestyle habits. This includes stopping smoking and avoiding alcohol, caffeine, and chocolate.  You may be advised to keep your head raised (elevated) when sleeping. This reduces the chance of acid going backward from your stomach into your esophagus.  Most of the time, nonspecific chest pain will improve within 2 to 3 days with rest and mild pain medicine. HOME CARE INSTRUCTIONS   If antibiotics were prescribed, take your antibiotics as directed. Finish them even if you start to feel better.  For the next few days, avoid physical activities that bring on chest pain. Continue physical activities as directed.  Do not smoke.  Avoid drinking alcohol.  Only take over-the-counter or prescription medicine for pain, discomfort, or fever as directed by  your caregiver.  Follow your caregiver's suggestions for further testing if your chest pain does not go away.  Keep any follow-up appointments you made. If you do not go to an appointment, you could develop lasting (chronic) problems with pain. If there is any problem keeping an appointment, you must call to reschedule. SEEK MEDICAL CARE IF:   You think you are having problems from the medicine you are taking. Read your medicine  instructions carefully.  Your chest pain does not go away, even after treatment.  You develop a rash with blisters on your chest. SEEK IMMEDIATE MEDICAL CARE IF:   You have increased chest pain or pain that spreads to your arm, neck, jaw, back, or abdomen.  You develop shortness of breath, an increasing cough, or you are coughing up blood.  You have severe back or abdominal pain, feel nauseous, or vomit.  You develop severe weakness, fainting, or chills.  You have a fever. THIS IS AN EMERGENCY. Do not wait to see if the pain will go away. Get medical help at once. Call your local emergency services (911 in [place] Do not drive yourself to the hospital. MAKE SURE YOU:   Understand these instructions.  Will watch your condition.  Will get help right away if you are not doing well or get worse. Document Released: 07/31/2005 Document Revised: 01/13/2012 Document Reviewed: 05/26/2008 [facility] Patient Information 2014 [place], [place].

## 2013-12-21 NOTE — ED Notes (Signed)
States she has not had her Clonazepam in a few days. She is in the process of having heart test. Echo scheduled for Friday.

## 2013-12-21 NOTE — ED Provider Notes (Signed)
CSN: [id number]     Arrival date & time 12/21/13  1331 History   First MD Initiated Contact with Patient 12/21/13 1452     Chief Complaint  Patient presents with  . Chest Pain     (Consider location/radiation/quality/duration/timing/severity/associated sxs/prior Treatment) HPI 33 y.o. Female complaining of left upper anterior sharp chest pain which began at work about 45 minutes pta.  Pain is stabbing and worsens with cough and movement. She denies fever, cough, leg swelling/pain, history of pe or dvt.  Pain is somewhat improved sitting here still but worsens when she has to move.  She is having a work up for heart at Dr. Rubye Oakssei-Bonsu's office because her blood pressure has been up and down.  She is a smoker and on depo.  Past Medical History  Diagnosis Date  . Anxiety   . GERD (gastroesophageal reflux disease)     Takes tums PRN  . Morbid obesity    Past Surgical History  Procedure Laterality Date  . Cesarean section    . Tonsillectomy    . Knee surgery    . Cholecystectomy    . Multiple extractions with alveoloplasty N/A 10/11/2013    Procedure: MULTIPLE EXTRACTION WITH ALVEOLOPLASTY;  Surgeon: Georgia LopesScott M Jensen, DDS;  Location: [facility] OR;  Service: Oral Surgery;  Laterality: N/A;   No family history on file. History  Substance Use Topics  . Smoking status: Current Every Day Smoker -- 0.50 packs/day for 16 years    Types: Cigarettes  . Smokeless tobacco: Never Used  . Alcohol Use: Yes     Comment: rare   OB History   Grav Para Term Preterm Abortions TAB SAB Ect Mult Living                 Review of Systems  All other systems reviewed and are negative.      Allergies  Latex; Naproxen; and Toradol  Home Medications   Current Outpatient Rx  Name  Route  Sig  Dispense  Refill  . clonazePAM (KLONOPIN) 0.5 MG tablet   Oral   Take 0.5 mg by mouth 2 (two) times daily as needed for anxiety.         . citalopram (CELEXA) 40 MG tablet   Oral   Take 1 tablet (40 mg total)  by mouth daily.   30 tablet   1   . medroxyPROGESTERone (DEPO-PROVERA) 150 MG/ML injection   Intramuscular   Inject 150 mg into the muscle every 3 (three) months.          Marland Kitchen. oxyCODONE-acetaminophen (PERCOCET) 5-325 MG per tablet   Oral   Take 1-2 tablets by mouth every 4 (four) hours as needed for severe pain.   40 tablet   0    BP 114/59  Pulse 60  Temp(Src) 98.9 F (37.2 C) (Oral)  Resp 15  Ht 5\' 4"  (1.626 m)  Wt 281 lb (127.461 kg)  BMI 48.21 kg/m2  SpO2 99% Physical Exam  Nursing note and vitals reviewed. Constitutional: She is oriented to person, place, and time. She appears well-developed and well-nourished.  Morbidly obese  HENT:  Head: Normocephalic and atraumatic.  Right Ear: External ear normal.  Left Ear: External ear normal.  Nose: Nose normal.  Mouth/Throat: Oropharynx is clear and moist.  Eyes: Conjunctivae and EOM are normal. Pupils are equal, round, and reactive to light.  Neck: Normal range of motion. Neck supple.  Cardiovascular: Normal rate, regular rhythm, normal heart sounds and intact distal pulses.  Pulmonary/Chest: Effort normal and breath sounds normal. She exhibits tenderness.  Abdominal: Soft. Bowel sounds are normal.  Musculoskeletal: Normal range of motion. She exhibits no edema and no tenderness.  No cords or swelling noted in legs  Neurological: She is alert and oriented to person, place, and time. She has normal reflexes.  Skin: Skin is warm and dry.  Psychiatric: She has a normal mood and affect. Her behavior is normal. Judgment and thought content normal.    ED Course  Procedures (including critical care time) Labs Review Labs Reviewed  COMPREHENSIVE METABOLIC PANEL - Abnormal; Notable for the following:    Total Bilirubin 0.2 (*)    All other components within normal limits  CBC WITH DIFFERENTIAL - Abnormal; Notable for the following:    WBC 13.5 (*)    Neutro Abs 9.1 (*)    All other components within normal limits   TROPONIN I  D-DIMER, QUANTITATIVE   Imaging Review Dg Chest 2 View  12/21/2013   CLINICAL DATA:  Chest pain  EXAM: CHEST  2 VIEW  COMPARISON:  None.  FINDINGS: Lungs are clear. Heart size and pulmonary vascularity are normal. No pneumothorax. No adenopathy. No bone lesions.  IMPRESSION: No abnormality noted.   Electronically Signed   By: Bretta Bang M.D.   On: 12/21/2013 14:35     Date: 12/21/2013  Rate: 87  Rhythm: normal sinus rhythm  QRS Axis: normal  Intervals: normal  ST/T Wave abnormalities: normal  Conduction Disutrbances:none  Narrative Interpretation:   Old EKG Reviewed: none available  Dg Chest 2 View  12/21/2013   CLINICAL DATA:  Chest pain  EXAM: CHEST  2 VIEW  COMPARISON:  None.  FINDINGS: Lungs are clear. Heart size and pulmonary vascularity are normal. No pneumothorax. No adenopathy. No bone lesions.  IMPRESSION: No abnormality noted.   Electronically Signed   By: Bretta Bang M.D.   On: 12/21/2013 14:35    MDM    32 y.o. Female with sharp chest pain with normal ekg, troponin, and d-dimer.  Patient has pain reproducible with movement.  Patient cautioned regarding need for recheck if change in symptoms and close follow up.       Hilario Quarry, MD 12/21/13 [phone]

## 2013-12-21 NOTE — ED Notes (Signed)
Stabbing pain to her left upper chest into her back x 45 minutes. Causes her to have diff breathing.

## 2013-12-21 NOTE — ED Notes (Signed)
MD at bedside discussing test results and dispo plan of care. 

## 2013-12-21 NOTE — ED Provider Notes (Signed)
Date: 12/21/2013  Rate: 87  Rhythm: normal sinus rhythm  QRS Axis: normal  Intervals: normal  ST/T Wave abnormalities: normal  Conduction Disutrbances:none  Narrative Interpretation:   Old EKG Reviewed: none available  No old EKG for comparison. No significant acute changes on today's EKG.    Shelda JakesScott W. Ima Hafner, MD 12/21/13 [phone]

## 2015-07-03 ENCOUNTER — Encounter (HOSPITAL_COMMUNITY): Payer: Self-pay | Admitting: Emergency Medicine

## 2015-07-03 ENCOUNTER — Emergency Department (INDEPENDENT_AMBULATORY_CARE_PROVIDER_SITE_OTHER): Payer: BLUE CROSS/BLUE SHIELD

## 2015-07-03 ENCOUNTER — Emergency Department (INDEPENDENT_AMBULATORY_CARE_PROVIDER_SITE_OTHER)
Admission: EM | Admit: 2015-07-03 | Discharge: 2015-07-03 | Disposition: A | Discharge status: Home / Self Care | Payer: BLUE CROSS/BLUE SHIELD | Source: Home / Self Care | Attending: Family Medicine | Admitting: Family Medicine

## 2015-07-03 DIAGNOSIS — J189 Pneumonia, unspecified organism: Secondary | ICD-10-CM

## 2015-07-03 MED ORDER — CEFTRIAXONE SODIUM 1 G IJ SOLR
1.0000 g | Freq: Once | INTRAMUSCULAR | Status: AC
Start: 2015-07-03 — End: 2015-07-03
  Administered 2015-07-03: 1 g via INTRAMUSCULAR

## 2015-07-03 MED ORDER — CEFTRIAXONE SODIUM 1 G IJ SOLR
INTRAMUSCULAR | Status: AC
  Filled 2015-07-03: qty 10

## 2015-07-03 MED ORDER — LIDOCAINE HCL (PF) 1 % IJ SOLN
INTRAMUSCULAR | Status: AC
  Filled 2015-07-03: qty 5

## 2015-07-03 MED ORDER — ACETAMINOPHEN 325 MG PO TABS
650.0000 mg | ORAL_TABLET | Freq: Once | ORAL | Status: AC
  Administered 2015-07-03: 650 mg via ORAL

## 2015-07-03 MED ORDER — MOXIFLOXACIN HCL 400 MG PO TABS: 400.0000 mg | ORAL_TABLET | Freq: Every day | ORAL | Status: DC

## 2015-07-03 MED ORDER — ACETAMINOPHEN 325 MG PO TABS
ORAL_TABLET | ORAL | Status: AC
  Filled 2015-07-03: qty 2

## 2015-07-03 NOTE — ED Notes (Signed)
Cough and feeling poorly since Friday 8/26

## 2015-07-03 NOTE — Discharge Instructions (Signed)
Take all of medicine, drink lots of fluids, no more smoking, see your doctor if further problems  °

## 2015-07-03 NOTE — ED Provider Notes (Signed)
CSN: [id number]     Arrival date & time 07/03/15  1318 History   First MD Initiated Contact with Patient 07/03/15 1436     Chief Complaint  Patient presents with  . Cough   (Consider location/radiation/quality/duration/timing/severity/associated sxs/prior Treatment) Patient is a 34 y.o. female presenting with cough. The history is provided by the patient.  Cough Cough characteristics:  Productive Sputum characteristics:  Yellow Severity:  Moderate Onset quality:  Gradual Duration:  3 days Progression:  Worsening Chronicity:  New Smoker: yes   Context: upper respiratory infection   Relieved by:  None tried Worsened by:  Nothing tried Ineffective treatments:  None tried Associated symptoms: chest pain, fever, rhinorrhea and shortness of breath     Past Medical History  Diagnosis Date  . Anxiety   . GERD (gastroesophageal reflux disease)     Takes tums PRN  . Morbid obesity    Past Surgical History  Procedure Laterality Date  . Cesarean section    . Tonsillectomy    . Knee surgery    . Cholecystectomy    . Multiple extractions with alveoloplasty N/A 10/11/2013    Procedure: MULTIPLE EXTRACTION WITH ALVEOLOPLASTY;  Surgeon: Georgia Lopes, DDS;  Location: [facility] OR;  Service: Oral Surgery;  Laterality: N/A;   No family history on file. Social History  Substance Use Topics  . Smoking status: Current Every Day Smoker -- 0.50 packs/day for 16 years    Types: Cigarettes  . Smokeless tobacco: Never Used  . Alcohol Use: Yes     Comment: rare   OB History    No data available     Review of Systems  Constitutional: Positive for fever.  HENT: Positive for rhinorrhea.   Respiratory: Positive for cough and shortness of breath.   Cardiovascular: Positive for chest pain.    Allergies  Latex; Naproxen; and Toradol  Home Medications   Prior to Admission medications   Medication Sig Start Date End Date Taking? Authorizing Provider  citalopram (CELEXA) 40 MG tablet Take 1  tablet (40 mg total) by mouth daily. 02/01/13   Tatyana Kirichenko, PA-C  clonazePAM (KLONOPIN) 0.5 MG tablet Take 0.5 mg by mouth 2 (two) times daily as needed for anxiety.    Historical Provider, MD  medroxyPROGESTERone (DEPO-PROVERA) 150 MG/ML injection Inject 150 mg into the muscle every 3 (three) months.     Historical Provider, MD  moxifloxacin (AVELOX) 400 MG tablet Take 1 tablet (400 mg total) by mouth daily. 07/03/15   Linna Hoff, MD  oxyCODONE-acetaminophen (PERCOCET) 5-325 MG per tablet Take 1-2 tablets by mouth every 4 (four) hours as needed for severe pain. 10/11/13   Ocie Doyne, DDS   Meds Ordered and Administered this Visit   Medications  cefTRIAXone (ROCEPHIN) injection 1 g (not administered)  acetaminophen (TYLENOL) tablet 650 mg (650 mg Oral Given 07/03/15 1523)    BP 142/83 mmHg  Pulse 102  Temp(Src) 101.7 F (38.7 C) (Oral)  Resp 12  SpO2 98%  LMP 06/19/2015 No data found.   Physical Exam  Constitutional: She is oriented to person, place, and time. She appears well-developed and well-nourished.  HENT:  Right Ear: External ear normal.  Left Ear: External ear normal.  Mouth/Throat: Oropharynx is clear and moist.  Eyes: Conjunctivae are normal. Pupils are equal, round, and reactive to light.  Neck: Normal range of motion. Neck supple.  Cardiovascular: Normal rate, normal heart sounds and intact distal pulses.   Pulmonary/Chest: Effort normal. She has decreased breath sounds. She  has rales in the right middle field.  Abdominal: Soft. Bowel sounds are normal. There is no tenderness.  Lymphadenopathy:    She has no cervical adenopathy.  Neurological: She is alert and oriented to person, place, and time.  Skin: Skin is warm. She is diaphoretic.  Nursing note and vitals reviewed.   ED Course  Procedures (including critical care time)  Labs Review Labs Reviewed - No data to display  Imaging Review Dg Chest 2 View  07/03/2015   CLINICAL DATA:  Cough and  fever.  EXAM: CHEST  2 VIEW  COMPARISON:  12/21/2013  FINDINGS: Cardiomediastinal silhouette is normal. Mediastinal contours appear intact.  There is patchy airspace consolidation within the right middle and portions of the upper lobe. There is no evidence of pleural effusion or pneumothorax.  Osseous structures are without acute abnormality. Soft tissues are grossly normal.  IMPRESSION: Right middle and upper lobe pneumonia. Follow-up to resolution is recommended.   Electronically Signed   By: Ted Mcalpine M.D.   On: 07/03/2015 15:22     Visual Acuity Review  Right Eye Distance:   Left Eye Distance:   Bilateral Distance:    Right Eye Near:   Left Eye Near:    Bilateral Near:         MDM   1. CAP (community acquired pneumonia)        Linna Hoff, MD 07/03/15 1550

## 2015-11-06 ENCOUNTER — Encounter (HOSPITAL_COMMUNITY): Payer: Self-pay | Admitting: Emergency Medicine

## 2015-11-06 ENCOUNTER — Emergency Department (HOSPITAL_COMMUNITY)
Admission: EM | Admit: 2015-11-06 | Discharge: 2015-11-06 | Disposition: A | Discharge status: Home / Self Care | Payer: BLUE CROSS/BLUE SHIELD | Attending: Emergency Medicine | Admitting: Emergency Medicine

## 2015-11-06 DIAGNOSIS — Z91040 Latex allergy status: Secondary | ICD-10-CM | POA: Insufficient documentation

## 2015-11-06 DIAGNOSIS — Z792 Long term (current) use of antibiotics: Secondary | ICD-10-CM | POA: Insufficient documentation

## 2015-11-06 DIAGNOSIS — J029 Acute pharyngitis, unspecified: Secondary | ICD-10-CM | POA: Diagnosis present

## 2015-11-06 DIAGNOSIS — Z8719 Personal history of other diseases of the digestive system: Secondary | ICD-10-CM | POA: Insufficient documentation

## 2015-11-06 DIAGNOSIS — F17210 Nicotine dependence, cigarettes, uncomplicated: Secondary | ICD-10-CM | POA: Insufficient documentation

## 2015-11-06 DIAGNOSIS — F419 Anxiety disorder, unspecified: Secondary | ICD-10-CM | POA: Insufficient documentation

## 2015-11-06 DIAGNOSIS — Z79899 Other long term (current) drug therapy: Secondary | ICD-10-CM | POA: Diagnosis not present

## 2015-11-06 DIAGNOSIS — Z793 Long term (current) use of hormonal contraceptives: Secondary | ICD-10-CM | POA: Diagnosis not present

## 2015-11-06 DIAGNOSIS — J039 Acute tonsillitis, unspecified: Secondary | ICD-10-CM | POA: Insufficient documentation

## 2015-11-06 LAB — RAPID STREP SCREEN (MED CTR MEBANE ONLY): Streptococcus, Group A Screen (Direct): NEGATIVE

## 2015-11-06 MED ORDER — DEXAMETHASONE SODIUM PHOSPHATE 10 MG/ML IJ SOLN
10.0000 mg | Freq: Once | INTRAMUSCULAR | Status: AC
  Administered 2015-11-06: 10 mg via INTRAMUSCULAR
  Filled 2015-11-06: qty 1

## 2015-11-06 NOTE — ED Notes (Signed)
Pt from home for eval of sore throat x3 days, states unable to eat and drink due to pain. Pt also states sometimes feels "like my throat is closing up." tenderness and swelling noted to pt left side of neck. White patches noted to bilateral tonsils. Pt airway intact.

## 2015-11-06 NOTE — Discharge Instructions (Signed)
There does not appear to be an emergent cause for your symptoms at this time. Your strep test was negative. Your symptoms are likely due to a virus and should resolve on their own. He may continue using Tylenol for your discomfort. Also use salt water gargles. Follow-up with your doctor in 1 week for reevaluation. Return to ED for any new or worsening symptoms as we discussed.  Tonsillitis Tonsillitis is an infection of the throat that causes the tonsils to become red, tender, and swollen. Tonsils are collections of lymphoid tissue at the back of the throat. Each tonsil has crevices (crypts). Tonsils help fight nose and throat infections and keep infection from spreading to other parts of the body for the first 18 months of life.  CAUSES Sudden (acute) tonsillitis is usually caused by infection with streptococcal bacteria. Long-lasting (chronic) tonsillitis occurs when the crypts of the tonsils become filled with pieces of food and bacteria, which makes it easy for the tonsils to become repeatedly infected. SYMPTOMS  Symptoms of tonsillitis include:  A sore throat, with possible difficulty swallowing.  White patches on the tonsils.  Fever.  Tiredness.  New episodes of snoring during sleep, when you did not snore before.  Small, foul-smelling, yellowish-white pieces of material (tonsilloliths) that you occasionally cough up or spit out. The tonsilloliths can also cause you to have bad breath. DIAGNOSIS Tonsillitis can be diagnosed through a physical exam. Diagnosis can be confirmed with the results of lab tests, including a throat culture. TREATMENT  The goals of tonsillitis treatment include the reduction of the severity and duration of symptoms and prevention of associated conditions. Symptoms of tonsillitis can be improved with the use of steroids to reduce the swelling. Tonsillitis caused by bacteria can be treated with antibiotic medicines. Usually, treatment with antibiotic medicines is  started before the cause of the tonsillitis is known. However, if it is determined that the cause is not bacterial, antibiotic medicines will not treat the tonsillitis. If attacks of tonsillitis are severe and frequent, your health care provider may recommend surgery to remove the tonsils (tonsillectomy). HOME CARE INSTRUCTIONS   Rest as much as possible and get plenty of sleep.  Drink plenty of fluids. While the throat is very sore, eat soft foods or liquids, such as sherbet, soups, or instant breakfast drinks.  Eat frozen ice pops.  Gargle with a warm or cold liquid to help soothe the throat. Mix 1/4 teaspoon of salt and 1/4 teaspoon of baking soda in 8 oz of water. SEEK MEDICAL CARE IF:   Large, tender lumps develop in your neck.  A rash develops.  A green, yellow-brown, or bloody substance is coughed up.  You are unable to swallow liquids or food for 24 hours.  You notice that only one of the tonsils is swollen. SEEK IMMEDIATE MEDICAL CARE IF:   You develop any new symptoms such as vomiting, severe headache, stiff neck, chest pain, or trouble breathing or swallowing.  You have severe throat pain along with drooling or voice changes.  You have severe pain, unrelieved with recommended medications.  You are unable to fully open the mouth.  You develop redness, swelling, or severe pain anywhere in the neck.  You have a fever. MAKE SURE YOU:   Understand these instructions.  Will watch your condition.  Will get help right away if you are not doing well or get worse.   This information is not intended to replace advice given to you by your health care provider.  Make sure you discuss any questions you have with your health care provider.   Document Released: 07/31/2005 Document Revised: 11/11/2014 Document Reviewed: 04/09/2013 [employer] Interactive Patient Education [employer]

## 2015-11-06 NOTE — ED Provider Notes (Signed)
CSN: [id number]     Arrival date & time 11/06/15  1212 History   First MD Initiated Contact with Patient 11/06/15 1322     Chief Complaint  Patient presents with  . Sore Throat     (Consider location/radiation/quality/duration/timing/severity/associated sxs/prior Treatment) HPI Tracy Vance is a 35 y.o. female who comes in for evaluation of sore throat. Patient reports sore throat since Saturday as well as swollen lymph nodes. She reports difficulty swallowing due to the pain. She reports associated fever and cough. She denies any changes in voice, difficulty breathing, chest pain, neck pain. She has taken Tylenol which seems to help with the fever. No sick contacts. No other modifying factors.  Past Medical History  Diagnosis Date  . Anxiety   . GERD (gastroesophageal reflux disease)     Takes tums PRN  . Morbid obesity [facility])    Past Surgical History  Procedure Laterality Date  . Cesarean section    . Tonsillectomy    . Vance surgery    . Cholecystectomy    . Multiple extractions with alveoloplasty N/A 10/11/2013    Procedure: MULTIPLE EXTRACTION WITH ALVEOLOPLASTY;  Surgeon: Georgia Lopes, DDS;  Location: [facility] OR;  Service: Oral Surgery;  Laterality: N/A;   No family history on file. Social History  Substance Use Topics  . Smoking status: Current Every Day Smoker -- 0.50 packs/day for 16 years    Types: Cigarettes  . Smokeless tobacco: Never Used  . Alcohol Use: Yes     Comment: rare   OB History    No data available     Review of Systems A 10 point review of systems was completed and was negative except for pertinent positives and negatives as mentioned in the history of present illness     Allergies  Latex; Naproxen; and Toradol  Home Medications   Prior to Admission medications   Medication Sig Start Date End Date Taking? Authorizing Provider  citalopram (CELEXA) 40 MG tablet Take 1 tablet (40 mg total) by mouth daily. 02/01/13   Tatyana Kirichenko, PA-C   clonazePAM (KLONOPIN) 0.5 MG tablet Take 0.5 mg by mouth 2 (two) times daily as needed for anxiety.    Historical Provider, MD  medroxyPROGESTERone (DEPO-PROVERA) 150 MG/ML injection Inject 150 mg into the muscle every 3 (three) months.     Historical Provider, MD  moxifloxacin (AVELOX) 400 MG tablet Take 1 tablet (400 mg total) by mouth daily. 07/03/15   Linna Hoff, MD  oxyCODONE-acetaminophen (PERCOCET) 5-325 MG per tablet Take 1-2 tablets by mouth every 4 (four) hours as needed for severe pain. 10/11/13   Ocie Doyne, DDS   BP 130/80 mmHg  Pulse 100  Temp(Src) 99.2 F (37.3 C) (Oral)  Resp 16  Ht 5\' 4"  (1.626 m)  Wt 135.172 kg  BMI 51.13 kg/m2  SpO2 98%  LMP 10/24/2015 Physical Exam  Constitutional: She is oriented to person, place, and time. She appears well-developed and well-nourished.  Obese white female  HENT:  Head: Normocephalic and atraumatic.  Mouth/Throat: Oropharynx is clear and moist.  Bilateral tonsillitis with exudates, mild erythema. Patent airway. Tolerating secretions well. No trismus, glossal elevation.  Eyes: Conjunctivae are normal. Pupils are equal, round, and reactive to light. Right eye exhibits no discharge. Left eye exhibits no discharge. No scleral icterus.  Neck: Normal range of motion. Neck supple. No tracheal deviation present.  Cardiovascular: Normal rate, regular rhythm and normal heart sounds.   Pulmonary/Chest: Effort normal and breath sounds normal. No stridor.  No respiratory distress. She has no wheezes. She has no rales.  Abdominal: Soft. There is no tenderness.  Musculoskeletal: She exhibits no tenderness.  Lymphadenopathy:    She has cervical adenopathy.  Neurological: She is alert and oriented to person, place, and time.  Cranial Nerves II-XII grossly intact  Skin: Skin is warm and dry. No rash noted.  Psychiatric: She has a normal mood and affect.  Nursing note and vitals reviewed.   ED Course  Procedures (including critical care  time) Labs Review Labs Reviewed  RAPID STREP SCREEN (NOT AT [facility])  CULTURE, GROUP A STREP    Imaging Review No results found. I have personally reviewed and evaluated these images and lab results as part of my medical decision-making.   EKG Interpretation None     Meds given in ED:  Medications  dexamethasone (DECADRON) injection 10 mg (10 mg Intramuscular Given 11/06/15 1452)    Discharge Medication List as of 11/06/2015  2:42 PM     Filed Vitals:   11/06/15 1310 11/06/15 1455  BP: 135/80 130/80  Pulse: 102 100  Temp: 99.4 F (37.4 C) 99.2 F (37.3 C)  TempSrc: Oral Oral  Resp: 16 16  Height: 5\' 4"  (1.626 m)   Weight: 135.172 kg   SpO2: 98% 98%    MDM  Tracy KneeJessica Vance is a 35 y.o. female who presents for evaluation of sore throat. Patient Centor criteria 2, will obtain rapid strep. Rapid strep is negative. Will treat for viral pharyngitis. There is no unilateral tonsillar swelling, uvular deviation, trismus, glossal elevation. No other suggestion of Ludwig's angina, retropharyngeal or peritonsillar abscess. Given IM Decadron in ED and encouraged continue use of OTC medications at home. Follow-up with PCP. Discussed strict return precautions including worsening pain, inability to speak or swallow, difficulties opening jaw, redness or swelling around the neck. Patient verbalizes understanding and agrees to this plan and subsequent discharge. Overall, patient appears well, nontoxic and appropriate for discharge. There is no tachycardia on my exam, heart rate mid 90s. The patient appears reasonably screened and/or stabilized for discharge and I doubt any other medical condition or other [facility] requiring further screening, evaluation, or treatment in the ED at this time prior to discharge.   Final diagnoses:  Tonsillitis with exudate        Tracy PeekBenjamin Aviendha Azbell, PA-C 11/07/15 [phone]  Pricilla LovelessScott Goldston, MD 11/07/15 [phone]

## 2015-11-08 LAB — CULTURE, GROUP A STREP

## 2016-03-08 ENCOUNTER — Other Ambulatory Visit: Payer: Self-pay | Admitting: Physician Assistant

## 2016-03-08 DIAGNOSIS — R5381 Other malaise: Secondary | ICD-10-CM

## 2016-06-18 ENCOUNTER — Ambulatory Visit: Payer: BLUE CROSS/BLUE SHIELD | Admitting: Family Medicine

## 2018-06-30 ENCOUNTER — Encounter (HOSPITAL_COMMUNITY): Payer: Self-pay | Admitting: Emergency Medicine

## 2018-06-30 ENCOUNTER — Other Ambulatory Visit: Payer: Self-pay

## 2018-06-30 ENCOUNTER — Ambulatory Visit (HOSPITAL_COMMUNITY)
Admission: EM | Admit: 2018-06-30 | Discharge: 2018-06-30 | Disposition: A | Discharge status: Home / Self Care | Payer: BLUE CROSS/BLUE SHIELD | Attending: Family Medicine | Admitting: Family Medicine

## 2018-06-30 DIAGNOSIS — G518 Other disorders of facial nerve: Secondary | ICD-10-CM

## 2018-06-30 DIAGNOSIS — G519 Disorder of facial nerve, unspecified: Secondary | ICD-10-CM

## 2018-06-30 DIAGNOSIS — H9202 Otalgia, left ear: Secondary | ICD-10-CM

## 2018-06-30 HISTORY — DX: Diseases of the nervous system complicating pregnancy, unspecified trimester: O99.350

## 2018-06-30 HISTORY — DX: Diseases of the nervous system complicating pregnancy, unspecified trimester: G51.0

## 2018-06-30 NOTE — ED Provider Notes (Signed)
[facility]   [id number] 06/30/18 Arrival Time: 1612  CC: Left ear pain and decreased sensation about the left side of face  SUBJECTIVE: History from: patient. Tracy Vance is a 37 y.o. female complains of gradually progressing left sided facial pain and numbness that began yesterday.  Denies a precipitating event or specific injury.  Localizes the symptoms to the left side of face.  States symptoms began as left ear pain and progressed to her left cheek and tongue.  Describes the pain as constant and sharp and throbbing in character.  Has tried OTC medications including tylenol and ibuprofen without relief.  Denies aggravating factors.   Complains of decreased taste, ear pain, and left arm "heaviness," as well as nausea.  Denies fever, chills, vomiting, chest pain, weakness, dizziness, incoordination, memory changes, vision changes, blurred vision, slurred speech, or HA.    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  However, patient states she did not have pain with her symptoms.    ROS: As per HPI.  Past Medical History:  Diagnosis Date  . Anxiety   . Bell's palsy during pregnancy, antepartum   . GERD (gastroesophageal reflux disease)    Takes tums PRN  . Morbid obesity (HCC)    Past Surgical History:  Procedure Laterality Date  . CESAREAN SECTION    . CHOLECYSTECTOMY    . KNEE SURGERY    . MULTIPLE EXTRACTIONS WITH ALVEOLOPLASTY N/A 10/11/2013   Procedure: MULTIPLE EXTRACTION WITH ALVEOLOPLASTY;  Surgeon: Georgia Lopes, DDS;  Location: [facility] OR;  Service: Oral Surgery;  Laterality: N/A;  . TONSILLECTOMY     Allergies  Allergen Reactions  . Latex Hives  . Naproxen Other (See Comments)    Increased BP, breathing difficulty  . Toradol [Ketorolac Tromethamine]     Increased BP, breathing difficulty   No current facility-administered medications on file prior to encounter.    Current Outpatient Medications on File Prior to Encounter    Medication Sig Dispense Refill  . citalopram (CELEXA) 40 MG tablet Take 1 tablet (40 mg total) by mouth daily. 30 tablet 1  . clonazePAM (KLONOPIN) 0.5 MG tablet Take 0.5 mg by mouth 2 (two) times daily as needed for anxiety.    . medroxyPROGESTERone (DEPO-PROVERA) 150 MG/ML injection Inject 150 mg into the muscle every 3 (three) months.     . moxifloxacin (AVELOX) 400 MG tablet Take 1 tablet (400 mg total) by mouth daily. 7 tablet 0  . oxyCODONE-acetaminophen (PERCOCET) 5-325 MG per tablet Take 1-2 tablets by mouth every 4 (four) hours as needed for severe pain. 40 tablet 0   Social History   Socioeconomic History  . Marital status: Single    Spouse name: Not on file  . Number of children: Not on file  . Years of education: Not on file  . Highest education level: Not on file  Occupational History  . Not on file  Social Needs  . Financial resource strain: Not on file  . Food insecurity:    Worry: Not on file    Inability: Not on file  . Transportation needs:    Medical: Not on file    Non-medical: Not on file  Tobacco Use  . Smoking status: Current Every Day Smoker    Packs/day: 0.50    Years: 16.00    Pack years: 8.00    Types: Cigarettes  . Smokeless tobacco: Never Used  Substance and Sexual Activity  . Alcohol use: Yes  Comment: rare  . Drug use: No  . Sexual activity: Not on file  Lifestyle  . Physical activity:    Days per week: Not on file    Minutes per session: Not on file  . Stress: Not on file  Relationships  . Social connections:    Talks on phone: Not on file    Gets together: Not on file    Attends religious service: Not on file    Active member of club or organization: Not on file    Attends meetings of clubs or organizations: Not on file    Relationship status: Not on file  . Intimate partner violence:    Fear of current or ex partner: Not on file    Emotionally abused: Not on file    Physically abused: Not on file    Forced sexual activity:  Not on file  Other Topics Concern  . Not on file  Social History Narrative  . Not on file   Family History  Problem Relation Age of Onset  . Hypertension Mother   . Hypertension Father     OBJECTIVE:  Vitals:   06/30/18 1635  BP: (!) 141/89  Pulse: 96  Resp: (!) 22  Temp: 98.1 F (36.7 C)  TempSrc: Oral  SpO2: 96%    General appearance: AOx3; no distress; appears stated age HEENT: normocephalic; atraumatic; PERRL; EOMI grossly; RT EAC clear without otorrhea; RT TMs pearly gray with visible cone of light; LT EAC erythematous, with injected TM Nose without rhinorrhea; oropharynx clear, dentition intact Neck: supple with FROM  Lungs: clear to auscultation bilaterally Heart: RRR without murmur gallop or rub Extremities: moves all extremities normally; no cyanosis or edema; symmetrical with no gross deformities Skin: warm and dry Neurologic: no slurred speech; CN 2-12 grossly intact; ambulates without difficulty; Finger to nose without difficulty, strength and sensation intact and symmetrical about the upper and lower extremities; admits to decreased sensation about the left cheek; negative pronator drift; negative romberg Psychological: alert and cooperative; normal mood and affect.  No neuropsych impairments (vacant stare, delayed verbal expression, inability to focus, disorientation, slurred or incoherent speech, emotional lability, or obvious memory deficits.   Psychological: alert and cooperative; normal mood and affect  MDM:   Patient presents with left ear pain and decreased sensation about the left side of face.  Discussed patient case with Dr. Tracie HarrierHagler.  He recommended watchful waiting for the next 24-48 hours.  There does not appear to be any threat to life at this point with mostly unremarkable exam and stable VS.  Discussed option of further evaluation and management in the ED. Pt would like to wait at this time. Given strict ED precautions.  Patient aware and in agreement  with this plan.    ASSESSMENT & PLAN:  1. Left ear pain   2. Abnormality of facial nerve    No orders of the defined types were placed in this encounter.  Recommend watchful waiting for the next 24-48 hours There is not immediate threat to life as of right now If you have any abrupt new or worsening symptoms go to the ER immediately.  These included slurred speech, weakness, facial paralysis, balance off, blurred vision or vision changes, arm weakness, etc..  Reviewed expectations re: course of current medical issues. Questions answered. Outlined signs and symptoms indicating need for more acute intervention. Patient verbalized understanding. After Visit Summary given.    Rennis HardingWurst, Dayten Juba, PA-C 06/30/18 1816

## 2018-06-30 NOTE — ED Triage Notes (Signed)
Yesterday had left ear pain.  Today no sense of taste, tongue feels numb.  Left side of face feels different than right side of face.  Reports a sore in back of mouth.  Pain in left side of sore throat.  Left side of face swelling.  Left eye feels funny.    Patient reports having bells palsy on right side of face 9 years ago, but there was no pain and no sore in mouth at that time

## 2018-06-30 NOTE — Discharge Instructions (Addendum)
Recommend watchful waiting for the next 24-48 hours There is not immediate threat to life as of right now If you have any abrupt new or worsening symptoms go to the ER immediately.  These included slurred speech, weakness, facial paralysis, balance off, blurred vision or vision changes, arm weakness, etc..

## 2018-07-01 ENCOUNTER — Emergency Department (HOSPITAL_COMMUNITY)
Admission: EM | Admit: 2018-07-01 | Discharge: 2018-07-02 | Disposition: A | Discharge status: Home / Self Care | Payer: BLUE CROSS/BLUE SHIELD | Attending: Emergency Medicine | Admitting: Emergency Medicine

## 2018-07-01 ENCOUNTER — Other Ambulatory Visit: Payer: Self-pay

## 2018-07-01 ENCOUNTER — Encounter (HOSPITAL_COMMUNITY): Payer: Self-pay

## 2018-07-01 DIAGNOSIS — G510 Bell's palsy: Secondary | ICD-10-CM | POA: Diagnosis not present

## 2018-07-01 DIAGNOSIS — Z79899 Other long term (current) drug therapy: Secondary | ICD-10-CM | POA: Insufficient documentation

## 2018-07-01 DIAGNOSIS — F17210 Nicotine dependence, cigarettes, uncomplicated: Secondary | ICD-10-CM | POA: Diagnosis not present

## 2018-07-01 DIAGNOSIS — R29810 Facial weakness: Secondary | ICD-10-CM | POA: Diagnosis present

## 2018-07-01 LAB — CBC WITH DIFFERENTIAL/PLATELET
BASOS PCT: 1 %
Basophils Absolute: 0.1 10*3/uL (ref 0.0–0.1)
EOS ABS: 1.1 10*3/uL — AB (ref 0.0–0.7)
EOS PCT: 8 %
HCT: 42.7 % (ref 36.0–46.0)
Hemoglobin: 14.2 g/dL (ref 12.0–15.0)
Lymphocytes Relative: 30 %
Lymphs Abs: 4 10*3/uL (ref 0.7–4.0)
MCH: 27.1 pg (ref 26.0–34.0)
MCHC: 33.3 g/dL (ref 30.0–36.0)
MCV: 81.5 fL (ref 78.0–100.0)
MONOS PCT: 4 %
Monocytes Absolute: 0.5 10*3/uL (ref 0.1–1.0)
NEUTROS PCT: 57 %
Neutro Abs: 7.6 10*3/uL (ref 1.7–7.7)
PLATELETS: 398 10*3/uL (ref 150–400)
RBC: 5.24 MIL/uL — ABNORMAL HIGH (ref 3.87–5.11)
RDW: 13.4 % (ref 11.5–15.5)
WBC: 13.3 10*3/uL — ABNORMAL HIGH (ref 4.0–10.5)

## 2018-07-01 LAB — COMPREHENSIVE METABOLIC PANEL
ALBUMIN: 3.7 g/dL (ref 3.5–5.0)
ALT: 19 U/L (ref 0–44)
ANION GAP: 8 (ref 5–15)
AST: 19 U/L (ref 15–41)
Alkaline Phosphatase: 59 U/L (ref 38–126)
BUN: 16 mg/dL (ref 6–20)
CO2: 27 mmol/L (ref 22–32)
Calcium: 9.1 mg/dL (ref 8.9–10.3)
Chloride: 106 mmol/L (ref 98–111)
Creatinine, Ser: 0.74 mg/dL (ref 0.44–1.00)
GFR calc non Af Amer: >60 mL/min (ref >60)
GLUCOSE: 105 mg/dL — AB (ref 70–99)
POTASSIUM: 3.8 mmol/L (ref 3.5–5.1)
SODIUM: 141 mmol/L (ref 135–145)
Total Bilirubin: 0.2 mg/dL — ABNORMAL LOW (ref 0.3–1.2)
Total Protein: 6.9 g/dL (ref 6.5–8.1)

## 2018-07-01 MED ORDER — MORPHINE SULFATE (PF) 4 MG/ML IV SOLN
4.0000 mg | Freq: Once | INTRAVENOUS | Status: AC
  Administered 2018-07-01: 4 mg via INTRAVENOUS
  Filled 2018-07-01: qty 1

## 2018-07-01 NOTE — ED Provider Notes (Signed)
[facility]-EMERGENCY DEPT Provider Note   CSN: [id number] Arrival date & time: 07/01/18  1949     History   Chief Complaint Chief Complaint  Patient presents with  . Otalgia  . Numbness    facial    HPI Tracy Vance is a 37 y.o. female presenting for evaluation of ear pain, left-sided facial numbness, left-sided facial weakness, left-sided facial pain.  Patient states that she started to develop symptoms on Monday.  It began with ear pain.  And has since progressed into left-sided facial weakness, numbness, and pain.  She denies fall, trauma, or injury.  She states she was seen in urgent care yesterday, was told to keep an eye on her symptoms and follow-up as needed.  She denies vision changes, slurred speech, numbness or tingling of any extremity, chest pain, shortness of breath, nausea, vomiting, abdominal pain, urinary symptoms, abnormal bowel movements.  Patient states that she had Bell's palsy 9 years ago on the right side of her face, the numbness felt similar but she had no pain and more weakness at that time.  Patient states she has no medical problems other than anxiety, for which she is not taking any medication.  She takes no medications daily.   HPI  Past Medical History:  Diagnosis Date  . Anxiety   . Bell's palsy during pregnancy, antepartum   . GERD (gastroesophageal reflux disease)    Takes tums PRN  . Morbid obesity (HCC)     There are no active problems to display for this patient.   Past Surgical History:  Procedure Laterality Date  . CESAREAN SECTION    . CHOLECYSTECTOMY    . KNEE SURGERY    . MULTIPLE EXTRACTIONS WITH ALVEOLOPLASTY N/A 10/11/2013   Procedure: MULTIPLE EXTRACTION WITH ALVEOLOPLASTY;  Surgeon: Georgia Lopes, DDS;  Location: [facility] OR;  Service: Oral Surgery;  Laterality: N/A;  . TONSILLECTOMY       OB History   None      Home Medications    Prior to Admission medications   Medication Sig Start Date End  Date Taking? Authorizing Provider  ibuprofen (ADVIL,MOTRIN) 200 MG tablet Take 800 mg by mouth daily as needed for moderate pain.   Yes [provider]  levonorgestrel (MIRENA) 20 MCG/24HR IUD 1 each by Intrauterine route once.   Yes [provider]  citalopram (CELEXA) 40 MG tablet Take 1 tablet (40 mg total) by mouth daily. Patient not taking: Reported on 07/01/2018 02/01/13   Jaynie Crumble, PA-C  moxifloxacin (AVELOX) 400 MG tablet Take 1 tablet (400 mg total) by mouth daily. Patient not taking: Reported on 07/01/2018 07/03/15   Linna Hoff, MD  oxyCODONE-acetaminophen (PERCOCET) 5-325 MG per tablet Take 1-2 tablets by mouth every 4 (four) hours as needed for severe pain. Patient not taking: Reported on 07/01/2018 10/11/13   Ocie Doyne, DDS    Family History Family History  Problem Relation Age of Onset  . Hypertension Mother   . Hypertension Father     Social History Social History   Tobacco Use  . Smoking status: Current Every Day Smoker    Packs/day: 0.50    Years: 16.00    Pack years: 8.00    Types: Cigarettes  . Smokeless tobacco: Never Used  Substance Use Topics  . Alcohol use: Yes    Comment: rare  . Drug use: No     Allergies   Latex; Naproxen; and Toradol [ketorolac tromethamine]   Review of Systems Review  of Systems  HENT: Positive for ear pain (L ear pain).   Neurological: Positive for weakness (L sided facial weakness), numbness (L side face) and headaches (L side face).  All other systems reviewed and are negative.    Physical Exam Updated Vital Signs BP (!) 142/83 (BP Location: Left Arm)   Pulse 91   Temp 99.1 F (37.3 C) (Oral)   Resp 18   Ht 5\' 4"  (1.626 m)   Wt 136.1 kg   SpO2 100%   BMI 51.49 kg/m   Physical Exam  Constitutional: She is oriented to person, place, and time. She appears well-developed and well-nourished. No distress.  Obese female sitting comfortably in the bed in no acute distress  HENT:    Head: Normocephalic and atraumatic.  Swelling of the left ear canal.  No obvious vesicles or infection.  TMs nonerythematous and nonbulging bilaterally. OP clear without tonsillar swelling or exudate.  Uvula midline with equal palate rise. See neuro exam for remaining HEENT exam.  Eyes: Pupils are equal, round, and reactive to light. Conjunctivae and EOM are normal.  EOMI and PERRLA. No nystagmus  Neck: Normal range of motion. Neck supple.  Cardiovascular: Normal rate, regular rhythm and intact distal pulses.  Pulmonary/Chest: Effort normal and breath sounds normal. No respiratory distress. She has no wheezes.  Abdominal: Soft. She exhibits no distension and no mass. There is no tenderness. There is no guarding.  Musculoskeletal: Normal range of motion.  Strength intact x4.  Sensation intact x4.  Radial and pedal pulses intact bilaterally.  Patient is ambulatory without difficulty.  Neurological: She is alert and oriented to person, place, and time. A cranial nerve deficit and sensory deficit is present. GCS eye subscore is 4. GCS verbal subscore is 5. GCS motor subscore is 6.  Left-sided lower facial weakness noted on exam.  Able to raise eyebrows equally.  Patient reports decreased sensation of entire left side face.  No other neurologic deficits noted.  Negative pronator drift.  Grip strength intact.  Nose to finger intact.  Fine movement and coordination intact.  Skin: Skin is warm and dry.  Psychiatric: She has a normal mood and affect.  Nursing note and vitals reviewed.    ED Treatments / Results  Labs (all labs ordered are listed, but only abnormal results are displayed) Labs Reviewed  CBC WITH DIFFERENTIAL/PLATELET - Abnormal; Notable for the following components:      Result Value   WBC 13.3 (*)    RBC 5.24 (*)    Eosinophils Absolute 1.1 (*)    All other components within normal limits  COMPREHENSIVE METABOLIC PANEL - Abnormal; Notable for the following components:    Glucose, Bld 105 (*)    Total Bilirubin 0.2 (*)    All other components within normal limits    EKG None  Radiology No results found.  Procedures Procedures (including critical care time)  Medications Ordered in ED Medications  morphine 4 MG/ML injection 4 mg (4 mg Intravenous Given 07/01/18 2336)     Initial Impression / Assessment and Plan / ED Course  I have reviewed the triage vital signs and the nursing notes.  Pertinent labs & imaging results that were available during my care of the patient were reviewed by me and considered in my medical decision making (see chart for details).     Patient presenting for evaluation of left-sided facial numbness, weakness, and pain.  Neuro exam shows left-sided lower facial weakness without abnormality of the eyebrows or forehead.  This is atypical for Bell's palsy.  However, patient without other focal deficits, young, and without stroke risk factors.  Low likelihood for CVA. sxs began 4 days ago, outside of stroke window.  Case discussed with attending, will consult with neurology for further evaluation.  Basic labs obtained.  Discussed with Dr. Wilford Corner from neurology, who recommends transfer to [facility] for an MRI due to abnormal presentation of possible Bell's palsy.  Additionally, patient with a history of Bell's palsy, is very unlikely to have this twice.  Recommends MRI with and without contrast.  If MRI is negative, treat for Bell's palsy with antivirals, prednisone, and follow-up outpatient with neurology.  If MRI is positive, consult with neuro and admit.  Discussed with Dr. Rush Landmark, who evaluated the pt. Pt is now reporting L upper and lower extremity numbness/decreased sensation. [facility]. [facility] with [facility] Eudelia Bunch at [facility], who accepts the pt for transfer. MRI order placed.   Final Clinical Impressions(s) / ED Diagnoses   Final diagnoses:  None    ED Discharge Orders    None       Alveria Apley,  PA-C 07/02/18 0009    Tegeler, Canary Brim, MD 07/02/18 1320

## 2018-07-01 NOTE — ED Notes (Signed)
Per Sophia PA, [facility].

## 2018-07-01 NOTE — ED Triage Notes (Signed)
Pt presents to ED from home for L ear pain. Pt went to UC yesterday for the dame problem. Pt also endorsing L facial numbness.Previous hx of R sided bells palsy.

## 2018-07-02 ENCOUNTER — Emergency Department (HOSPITAL_COMMUNITY): Payer: BLUE CROSS/BLUE SHIELD

## 2018-07-02 DIAGNOSIS — R29810 Facial weakness: Secondary | ICD-10-CM | POA: Diagnosis present

## 2018-07-02 DIAGNOSIS — Z79899 Other long term (current) drug therapy: Secondary | ICD-10-CM | POA: Diagnosis not present

## 2018-07-02 DIAGNOSIS — F17210 Nicotine dependence, cigarettes, uncomplicated: Secondary | ICD-10-CM | POA: Diagnosis not present

## 2018-07-02 DIAGNOSIS — G510 Bell's palsy: Secondary | ICD-10-CM | POA: Diagnosis not present

## 2018-07-02 MED ORDER — GADOBENATE DIMEGLUMINE 529 MG/ML IV SOLN
20.0000 mL | Freq: Once | INTRAVENOUS | Status: AC | PRN
  Administered 2018-07-02: 20 mL via INTRAVENOUS

## 2018-07-02 MED ORDER — MORPHINE SULFATE (PF) 4 MG/ML IV SOLN: 4.0000 mg | Freq: Once | INTRAVENOUS | Status: DC

## 2018-07-02 MED ORDER — ACYCLOVIR 400 MG PO TABS: 400.0000 mg | ORAL_TABLET | Freq: Every day | ORAL | 0 refills | Status: AC

## 2018-07-02 MED ORDER — PREDNISONE 10 MG PO TABS: 60.0000 mg | ORAL_TABLET | Freq: Every day | ORAL | 0 refills | Status: AC

## 2018-07-02 MED ORDER — ACETAMINOPHEN 500 MG PO TABS
1000.0000 mg | ORAL_TABLET | Freq: Once | ORAL | Status: DC
  Filled 2018-07-02: qty 2

## 2018-07-02 MED ORDER — GABAPENTIN 100 MG PO CAPS: 100.0000 mg | ORAL_CAPSULE | Freq: Three times a day (TID) | ORAL | 0 refills | Status: DC

## 2018-07-02 NOTE — ED Notes (Signed)
MD provided patient with update.

## 2018-07-02 NOTE — ED Notes (Signed)
[facility]

## 2018-07-02 NOTE — ED Notes (Signed)
Patient ambulated to the restroom without assistance 

## 2018-07-02 NOTE — ED Notes (Signed)
Pt c/o headache, verbal order for MPAP from Cardama, MD

## 2018-07-02 NOTE — ED Notes (Signed)
Contacted [facility] for transport 

## 2018-07-02 NOTE — ED Provider Notes (Signed)
Patient transferred for MRI related to left facial weakness.  MRI negative.  Likely Bell's palsy.  Neurology recommended treatment with antivirals and prednisone.  Disposition: Discharge  Condition: Good  I have discussed the results, Dx and Tx plan with the patient who expressed understanding and agree(s) with the plan. Discharge instructions discussed at great length. The patient was given strict return precautions who verbalized understanding of the instructions. No further questions at time of discharge.    ED Discharge Orders         Ordered    predniSONE (DELTASONE) 10 MG tablet  Daily     07/02/18 0436    acyclovir (ZOVIRAX) 400 MG tablet  5 times daily     07/02/18 0436    gabapentin (NEURONTIN) 100 MG capsule  3 times daily     07/02/18 0436           Follow Up: [facility] [address]     [address] [place] [id number] [phone] Schedule an appointment as soon as possible for a visit  As needed      Nira Connardama, Pedro Eduardo, MD 07/02/18 [phone]

## 2018-07-20 ENCOUNTER — Encounter (HOSPITAL_COMMUNITY): Payer: Self-pay | Admitting: Emergency Medicine

## 2018-07-20 ENCOUNTER — Emergency Department (HOSPITAL_COMMUNITY)
Admission: EM | Admit: 2018-07-20 | Discharge: 2018-07-20 | Disposition: A | Discharge status: Home / Self Care | Payer: BLUE CROSS/BLUE SHIELD | Attending: Emergency Medicine | Admitting: Emergency Medicine

## 2018-07-20 ENCOUNTER — Other Ambulatory Visit: Payer: Self-pay

## 2018-07-20 DIAGNOSIS — H16002 Unspecified corneal ulcer, left eye: Secondary | ICD-10-CM

## 2018-07-20 DIAGNOSIS — Z91040 Latex allergy status: Secondary | ICD-10-CM | POA: Insufficient documentation

## 2018-07-20 DIAGNOSIS — F17210 Nicotine dependence, cigarettes, uncomplicated: Secondary | ICD-10-CM | POA: Insufficient documentation

## 2018-07-20 DIAGNOSIS — Z79899 Other long term (current) drug therapy: Secondary | ICD-10-CM | POA: Insufficient documentation

## 2018-07-20 MED ORDER — FLUORESCEIN SODIUM 1 MG OP STRP
1.0000 | ORAL_STRIP | Freq: Once | OPHTHALMIC | Status: AC
  Administered 2018-07-20: 1 via OPHTHALMIC
  Filled 2018-07-20: qty 1

## 2018-07-20 MED ORDER — TETRACAINE HCL 0.5 % OP SOLN
1.0000 [drp] | Freq: Once | OPHTHALMIC | Status: AC
  Administered 2018-07-20: 1 [drp] via OPHTHALMIC
  Filled 2018-07-20: qty 4

## 2018-07-20 MED ORDER — BACITRACIN-POLYMYXIN B 500-10000 UNIT/GM OP OINT
TOPICAL_OINTMENT | OPHTHALMIC | Status: DC
  Administered 2018-07-20: 22:00:00 via OPHTHALMIC
  Filled 2018-07-20: qty 3.5

## 2018-07-20 NOTE — ED Notes (Signed)
Pharmacy messaged to send ophthalmic ointment to [facility].

## 2018-07-20 NOTE — ED Triage Notes (Addendum)
Pt reports hx of bell's palsy, has droop to L-side. Pt reports L side facial pain. Pt reports her L eye has been irritated, conjunctiva is red using gentile eye drops with no relief. Pt reports yellow drainage from eye today. Pt also reports sore throat. Pt also reports fever of 101 at home, took ibuprofen.

## 2018-07-20 NOTE — ED Notes (Signed)
Patient ambulatory to bathroom with steady gait at this time 

## 2018-07-20 NOTE — ED Notes (Signed)
Patient verbalizes understanding of discharge instructions. Opportunity for questioning and answers were provided. Armband removed by staff, pt discharged from ED ambulatory.   

## 2018-07-20 NOTE — ED Provider Notes (Signed)
[facility] EMERGENCY DEPARTMENT Provider Note   CSN: [id number] Arrival date & time: 07/20/18  1613     History   Chief Complaint Chief Complaint  Patient presents with  . Eye Pain  . Headache    HPI Tracy Vance is a 37 y.o. female.  HPI    37 year old female presents today with complaints of left-sided eye pain.  Patient notes on 828 she was diagnosed with Bell's palsy.  She had some paralysis on the left side.  She was started on antivirals, prednisone, and discharged with outpatient follow-up.  Patient notes approximately 1 week ago she started to have irritation in the left eye it severely worsened yesterday with purulent discharge from the eye.  She notes no significant changes in her vision, notes pain to the left side of her face since diagnosis of Bell's palsy.  She notes a fever earlier in the day, she took ibuprofen early in the morning with improvement in symptoms.  She denies any other infectious etiology.  She notes she has been using eyedrops for symptomatic care of the left eye.   Past Medical History:  Diagnosis Date  . Anxiety   . Bell's palsy during pregnancy, antepartum   . GERD (gastroesophageal reflux disease)    Takes tums PRN  . Morbid obesity (HCC)     There are no active problems to display for this patient.   Past Surgical History:  Procedure Laterality Date  . CESAREAN SECTION    . CHOLECYSTECTOMY    . KNEE SURGERY    . MULTIPLE EXTRACTIONS WITH ALVEOLOPLASTY N/A 10/11/2013   Procedure: MULTIPLE EXTRACTION WITH ALVEOLOPLASTY;  Surgeon: Georgia Lopes, DDS;  Location: [facility] OR;  Service: Oral Surgery;  Laterality: N/A;  . TONSILLECTOMY       OB History   None      Home Medications    Prior to Admission medications   Medication Sig Start Date End Date Taking? Authorizing Provider  citalopram (CELEXA) 40 MG tablet Take 1 tablet (40 mg total) by mouth daily. Patient not taking: Reported on 07/01/2018 02/01/13    Jaynie Crumble, PA-C  gabapentin (NEURONTIN) 100 MG capsule Take 1 capsule (100 mg total) by mouth 3 (three) times daily for 10 days. 07/02/18 07/12/18  Nira Conn, MD  ibuprofen (ADVIL,MOTRIN) 200 MG tablet Take 800 mg by mouth daily as needed for moderate pain.    [provider]  levonorgestrel (MIRENA) 20 MCG/24HR IUD 1 each by Intrauterine route once.    [provider]  moxifloxacin (AVELOX) 400 MG tablet Take 1 tablet (400 mg total) by mouth daily. Patient not taking: Reported on 07/01/2018 07/03/15   Linna Hoff, MD  oxyCODONE-acetaminophen (PERCOCET) 5-325 MG per tablet Take 1-2 tablets by mouth every 4 (four) hours as needed for severe pain. Patient not taking: Reported on 07/01/2018 10/11/13   Ocie Doyne, DDS    Family History Family History  Problem Relation Age of Onset  . Hypertension Mother   . Hypertension Father     Social History Social History   Tobacco Use  . Smoking status: Current Every Day Smoker    Packs/day: 0.50    Years: 16.00    Pack years: 8.00    Types: Cigarettes  . Smokeless tobacco: Never Used  Substance Use Topics  . Alcohol use: Yes    Comment: rare  . Drug use: No     Allergies   Latex; Naproxen; and Toradol [ketorolac tromethamine]   Review  of Systems Review of Systems  All other systems reviewed and are negative.   Physical Exam Updated Vital Signs BP (!) 146/89 (BP Location: Right Arm)   Pulse (!) 105   Temp 99.5 F (37.5 C) (Oral)   Resp 16   SpO2 100%   Physical Exam  Constitutional: She is oriented to person, place, and time. She appears well-developed and well-nourished.  HENT:  Head: Normocephalic and atraumatic.  Left-sided facial droop, incomplete ability to close left eye, unable to raise left-sided musculature of the face-no lesions noted  Eyes: Pupils are equal, round, and reactive to light. Conjunctivae are normal. Right eye exhibits no discharge. Left eye exhibits no  discharge. No scleral icterus.  Left eye pupil round reactive to light, extraocular movements intact, bulbar and palpebral conjunctival injection-obvious defect circumferential noted at the left cornea at the 3 o'clock position-no purulent discharge noted-no surrounding cellulitis-vision 20/20 bilateral, left 20/25, right 20/20-floor seen uptake noted at the lesion  Neck: Normal range of motion. No JVD present. No tracheal deviation present.  Pulmonary/Chest: Effort normal. No stridor.  Neurological: She is alert and oriented to person, place, and time. Coordination normal.  Psychiatric: She has a normal mood and affect. Her behavior is normal. Judgment and thought content normal.  Nursing note and vitals reviewed.    ED Treatments / Results  Labs (all labs ordered are listed, but only abnormal results are displayed) Labs Reviewed - No data to display  EKG None  Radiology No results found.  Procedures Procedures (including critical care time)  Medications Ordered in ED Medications  bacitracin-polymyxin b (POLYSPORIN) ophthalmic ointment (has no administration in time range)  tetracaine (PONTOCAINE) 0.5 % ophthalmic solution 1 drop (1 drop Left Eye Given by Other 07/20/18 2145)  fluorescein ophthalmic strip 1 strip (1 strip Left Eye Given by Other 07/20/18 2145)     Initial Impression / Assessment and Plan / ED Course  I have reviewed the triage vital signs and the nursing notes.  Pertinent labs & imaging results that were available during my care of the patient were reviewed by me and considered in my medical decision making (see chart for details).     Labs:   Imaging:  Consults: Dr Dione BoozeGroat   Therapeutics: Bacitracin  Discharge Meds:   Assessment/Plan: 37 year old female presents today with corneal ulcer.  Patient has conjunctival injection.  No significant vision loss, ophthalmology was consulted who will evaluate patient in office tomorrow morning at 8 AM.  She will be  started on bacitracin, eye care instructions given, close follow-up instructions given.  Patient with no other acute findings here today, strict return precautions given.  She verbalized understanding and agreement to today's plan had no further questions or concerns.       Final Clinical Impressions(s) / ED Diagnoses   Final diagnoses:  Ulcer of left cornea    ED Discharge Orders    None       Rosalio LoudHedges, Admir Candelas, PA-C 07/20/18 2207    Margarita Grizzleay, Danielle, MD 07/23/18 1055

## 2018-07-20 NOTE — Discharge Instructions (Addendum)
Please read attached information. If you experience any new or worsening signs or symptoms please return to the emergency room for evaluation.  Please follow-up at Dr. Laruth BouchardGroat's office tomorrow morning at 8 AM for repeat evaluation.  Please wear eye protection at night and use antibiotics as directed.

## 2018-07-30 ENCOUNTER — Emergency Department (HOSPITAL_COMMUNITY): Payer: Self-pay

## 2018-07-30 ENCOUNTER — Other Ambulatory Visit: Payer: Self-pay

## 2018-07-30 ENCOUNTER — Emergency Department (HOSPITAL_COMMUNITY)
Admission: EM | Admit: 2018-07-30 | Discharge: 2018-07-30 | Disposition: A | Discharge status: Home / Self Care | Payer: Self-pay | Attending: Emergency Medicine | Admitting: Emergency Medicine

## 2018-07-30 DIAGNOSIS — Z91040 Latex allergy status: Secondary | ICD-10-CM | POA: Insufficient documentation

## 2018-07-30 DIAGNOSIS — Z79899 Other long term (current) drug therapy: Secondary | ICD-10-CM | POA: Insufficient documentation

## 2018-07-30 DIAGNOSIS — L03116 Cellulitis of left lower limb: Secondary | ICD-10-CM | POA: Insufficient documentation

## 2018-07-30 DIAGNOSIS — F17210 Nicotine dependence, cigarettes, uncomplicated: Secondary | ICD-10-CM | POA: Insufficient documentation

## 2018-07-30 MED ORDER — SULFAMETHOXAZOLE-TRIMETHOPRIM 800-160 MG PO TABS: 2.0000 | ORAL_TABLET | Freq: Two times a day (BID) | ORAL | 0 refills | Status: AC

## 2018-07-30 NOTE — Discharge Instructions (Addendum)
Return here as needed.  Follow-up with your doctor for recheck.  If the area gets more swollen and painful or have trouble moving her ankle you need to return here immediately.

## 2018-07-30 NOTE — ED Notes (Signed)
Pt verbalized understanding of discharge instructions and denies any further questions at this time.   

## 2018-07-30 NOTE — ED Provider Notes (Signed)
[facility] EMERGENCY DEPARTMENT Provider Note   CSN: [id number] Arrival date & time: 07/30/18  4098     History   Chief Complaint Chief Complaint  Patient presents with  . Ankle Pain    HPI Tracy Vance is a 37 y.o. female.  HPI Patient presents to the emergency department with redness and itching to the left medial ankle that extends up the leg to the upper portion of her anterior leg.  The area is fairly splotchy once it crosses at the halfway point of the calf region.  The patient states that she is able to walk on her ankle but this area does seem to be more irritated.  Patient has not had any fevers and no decreased range of motion of the ankle.  She states she did fall 4 days ago but did not have any injury to this area.  Patient denies fever, nausea, vomiting, weakness, dizziness, headache, blurred vision, numbness, weakness or syncope. Past Medical History:  Diagnosis Date  . Anxiety   . Bell's palsy during pregnancy, antepartum   . GERD (gastroesophageal reflux disease)    Takes tums PRN  . Morbid obesity (HCC)     There are no active problems to display for this patient.   Past Surgical History:  Procedure Laterality Date  . CESAREAN SECTION    . CHOLECYSTECTOMY    . KNEE SURGERY    . MULTIPLE EXTRACTIONS WITH ALVEOLOPLASTY N/A 10/11/2013   Procedure: MULTIPLE EXTRACTION WITH ALVEOLOPLASTY;  Surgeon: Georgia Lopes, DDS;  Location: [facility] OR;  Service: Oral Surgery;  Laterality: N/A;  . TONSILLECTOMY       OB History   None      Home Medications    Prior to Admission medications   Medication Sig Start Date End Date Taking? Authorizing Provider  citalopram (CELEXA) 40 MG tablet Take 1 tablet (40 mg total) by mouth daily. Patient not taking: Reported on 07/01/2018 02/01/13   Jaynie Crumble, PA-C  gabapentin (NEURONTIN) 100 MG capsule Take 1 capsule (100 mg total) by mouth 3 (three) times daily for 10 days. 07/02/18 07/12/18  Nira Conn, MD  ibuprofen (ADVIL,MOTRIN) 200 MG tablet Take 800 mg by mouth daily as needed for moderate pain.    [provider]  levonorgestrel (MIRENA) 20 MCG/24HR IUD 1 each by Intrauterine route once.    [provider]  moxifloxacin (AVELOX) 400 MG tablet Take 1 tablet (400 mg total) by mouth daily. Patient not taking: Reported on 07/01/2018 07/03/15   Linna Hoff, MD  oxyCODONE-acetaminophen (PERCOCET) 5-325 MG per tablet Take 1-2 tablets by mouth every 4 (four) hours as needed for severe pain. Patient not taking: Reported on 07/01/2018 10/11/13   Ocie Doyne, DDS    Family History Family History  Problem Relation Age of Onset  . Hypertension Mother   . Hypertension Father     Social History Social History   Tobacco Use  . Smoking status: Current Every Day Smoker    Packs/day: 0.50    Years: 16.00    Pack years: 8.00    Types: Cigarettes  . Smokeless tobacco: Never Used  Substance Use Topics  . Alcohol use: Yes    Comment: rare  . Drug use: No     Allergies   Latex; Naproxen; and Toradol [ketorolac tromethamine]   Review of Systems Review of Systems All other systems negative except as documented in the HPI. All pertinent positives and negatives as reviewed in the HPI.  Physical Exam Updated Vital Signs BP 127/81 (BP Location: Right Arm)   Pulse (!) 104   Temp 99 F (37.2 C) (Oral)   Resp 16   SpO2 97%   Physical Exam  Constitutional: She is oriented to person, place, and time. She appears well-developed and well-nourished. No distress.  HENT:  Head: Normocephalic and atraumatic.  Eyes: Pupils are equal, round, and reactive to light.  Pulmonary/Chest: Effort normal.  Musculoskeletal:       Feet:  Neurological: She is alert and oriented to person, place, and time.  Skin: Skin is warm and dry.  Psychiatric: She has a normal mood and affect.  Nursing note and vitals reviewed.    ED Treatments / Results  Labs (all labs ordered  are listed, but only abnormal results are displayed) Labs Reviewed - No data to display  EKG None  Radiology Dg Ankle Complete Left  Result Date: 07/30/2018 CLINICAL DATA:  Pain, redness and swelling left ankle since this morning. EXAM: LEFT ANKLE COMPLETE - 3+ VIEW COMPARISON:  None. FINDINGS: Mild generalized soft tissue swelling over the ankle. Ankle mortise is normal. No evidence of fracture or dislocation. No lytic or sclerotic lesions. No air within the soft tissues. IMPRESSION: No acute findings. Electronically Signed   By: Elberta Fortis M.D.   On: 07/30/2018 13:30    Procedures Procedures (including critical care time)  Medications Ordered in ED Medications - No data to display   Initial Impression / Assessment and Plan / ED Course  I have reviewed the triage vital signs and the nursing notes.  Pertinent labs & imaging results that were available during my care of the patient were reviewed by me and considered in my medical decision making (see chart for details).     Patient has no calf tenderness and she has full range of motion of the ankle without significant tenderness.  There does not appear to be any effusion noted on exam but there is some swelling to the area.  At this point I do not feel like she has a septic joint.  I am going to give her antibiotics for cellulitis and strict return precautions.  Patient is advised the plan and all questions were answered.  I did advise her to follow-up with her primary doctor as well.  Final Clinical Impressions(s) / ED Diagnoses   Final diagnoses:  None    ED Discharge Orders    None       Charlestine Night, PA-C 07/30/18 1345    Sabas Sous, MD 07/30/18 [phone]

## 2018-07-30 NOTE — ED Notes (Signed)
Patient transported to X-ray 

## 2018-07-30 NOTE — ED Triage Notes (Signed)
Pt in c/o L ankle swelling, L redness, and itching onset x 1 day, pt being treated for Bells palsy currently, skin intact

## 2019-01-26 ENCOUNTER — Emergency Department (HOSPITAL_COMMUNITY)
Admission: EM | Admit: 2019-01-26 | Discharge: 2019-01-26 | Disposition: A | Discharge status: Home / Self Care | Payer: BLUE CROSS/BLUE SHIELD | Attending: Emergency Medicine | Admitting: Emergency Medicine

## 2019-01-26 ENCOUNTER — Other Ambulatory Visit: Payer: Self-pay

## 2019-01-26 DIAGNOSIS — J020 Streptococcal pharyngitis: Secondary | ICD-10-CM | POA: Diagnosis not present

## 2019-01-26 DIAGNOSIS — R509 Fever, unspecified: Secondary | ICD-10-CM | POA: Diagnosis present

## 2019-01-26 DIAGNOSIS — Z79899 Other long term (current) drug therapy: Secondary | ICD-10-CM | POA: Diagnosis not present

## 2019-01-26 DIAGNOSIS — F17210 Nicotine dependence, cigarettes, uncomplicated: Secondary | ICD-10-CM | POA: Insufficient documentation

## 2019-01-26 LAB — GROUP A STREP BY PCR: Group A Strep by PCR: DETECTED — AB

## 2019-01-26 MED ORDER — PENICILLIN G BENZATHINE & PROC 1200000 UNIT/2ML IM SUSP
1.2000 10*6.[IU] | Freq: Once | INTRAMUSCULAR | Status: AC
  Administered 2019-01-26: 1.2 10*6.[IU] via INTRAMUSCULAR
  Filled 2019-01-26: qty 2

## 2019-01-26 NOTE — ED Notes (Signed)
Patient verbalizes understanding of discharge instructions. Opportunity for questioning and answers were provided. Armband removed by staff, pt discharged from ED.  

## 2019-01-26 NOTE — ED Provider Notes (Signed)
[facility] EMERGENCY DEPARTMENT Provider Note   CSN: [id number] Arrival date & time: 01/26/19  1948    History   Chief Complaint Chief Complaint  Patient presents with  . Fever  . Sore Throat    HPI Tracy Vance is a 38 y.o. female who presents today for evaluation of sore throat.  She reports that she started having a sore throat yesterday.  She developed a fever today with temps over 101 at home.  She does not have any known sick contacts, is unsure how she got strep throat.  She denies any nausea vomiting or diarrhea.  No chest pain or shortness of breath.  She denies difficulty swallowing.     HPI  Past Medical History:  Diagnosis Date  . Anxiety   . Bell's palsy during pregnancy, antepartum   . GERD (gastroesophageal reflux disease)    Takes tums PRN  . Morbid obesity (HCC)     There are no active problems to display for this patient.   Past Surgical History:  Procedure Laterality Date  . CESAREAN SECTION    . CHOLECYSTECTOMY    . KNEE SURGERY    . MULTIPLE EXTRACTIONS WITH ALVEOLOPLASTY N/A 10/11/2013   Procedure: MULTIPLE EXTRACTION WITH ALVEOLOPLASTY;  Surgeon: Georgia Lopes, DDS;  Location: [facility] OR;  Service: Oral Surgery;  Laterality: N/A;  . TONSILLECTOMY       OB History   No obstetric history on file.      Home Medications    Prior to Admission medications   Medication Sig Start Date End Date Taking? Authorizing Provider  citalopram (CELEXA) 40 MG tablet Take 1 tablet (40 mg total) by mouth daily. Patient not taking: Reported on 07/01/2018 02/01/13   Jaynie Crumble, PA-C  gabapentin (NEURONTIN) 100 MG capsule Take 1 capsule (100 mg total) by mouth 3 (three) times daily for 10 days. 07/02/18 07/12/18  Nira Conn, MD  ibuprofen (ADVIL,MOTRIN) 200 MG tablet Take 800 mg by mouth daily as needed for moderate pain.    [provider]  levonorgestrel (MIRENA) 20 MCG/24HR IUD 1 each by Intrauterine route once.     [provider]  moxifloxacin (AVELOX) 400 MG tablet Take 1 tablet (400 mg total) by mouth daily. Patient not taking: Reported on 07/01/2018 07/03/15   Linna Hoff, MD  oxyCODONE-acetaminophen (PERCOCET) 5-325 MG per tablet Take 1-2 tablets by mouth every 4 (four) hours as needed for severe pain. Patient not taking: Reported on 07/01/2018 10/11/13   Ocie Doyne, DDS    Family History Family History  Problem Relation Age of Onset  . Hypertension Mother   . Hypertension Father     Social History Social History   Tobacco Use  . Smoking status: Current Every Day Smoker    Packs/day: 0.50    Years: 16.00    Pack years: 8.00    Types: Cigarettes  . Smokeless tobacco: Never Used  Substance Use Topics  . Alcohol use: Yes    Comment: rare  . Drug use: No     Allergies   Latex; Naproxen; and Toradol [ketorolac tromethamine]   Review of Systems Review of Systems  Constitutional: Positive for chills and fever.  HENT: Positive for sore throat and voice change. Negative for congestion, drooling, facial swelling, rhinorrhea, sinus pressure, sinus pain and trouble swallowing.   Eyes: Negative for visual disturbance.  Respiratory: Negative for chest tightness and shortness of breath.   Cardiovascular: Negative for chest pain.  Gastrointestinal: Negative for  abdominal pain.  Neurological: Negative for weakness and headaches.  All other systems reviewed and are negative.    Physical Exam Updated Vital Signs BP (!) 158/110 (BP Location: Right Arm)   Pulse (!) 118   Temp 98.1 F (36.7 C) (Oral)   Resp 16   Ht 5\' 4"  (1.626 m)   Wt 131.1 kg   SpO2 98%   BMI 49.61 kg/m   Physical Exam Vitals signs and nursing note reviewed.  Constitutional:      General: She is not in acute distress.    Appearance: She is not ill-appearing.  HENT:     Head: Normocephalic and atraumatic.     Mouth/Throat:     Mouth: Mucous membranes are moist.     Pharynx: Uvula midline. No  pharyngeal swelling or posterior oropharyngeal erythema.     Tonsils: Tonsillar exudate present. No tonsillar abscesses. 2+ on the right. 1+ on the left.  Eyes:     Conjunctiva/sclera: Conjunctivae normal.  Neck:     Musculoskeletal: Normal range of motion and neck supple.     Thyroid: No thyromegaly.  Cardiovascular:     Rate and Rhythm: Normal rate and regular rhythm.     Heart sounds: Normal heart sounds. No murmur.  Pulmonary:     Effort: Pulmonary effort is normal. No respiratory distress.  Lymphadenopathy:     Cervical: No cervical adenopathy.  Skin:    General: Skin is warm and dry.  Neurological:     Mental Status: She is alert.      ED Treatments / Results  Labs (all labs ordered are listed, but only abnormal results are displayed) Labs Reviewed  GROUP A STREP BY PCR - Abnormal; Notable for the following components:      Result Value   Group A Strep by PCR DETECTED (*)    All other components within normal limits    EKG None  Radiology No results found.  Procedures Procedures (including critical care time)  Medications Ordered in ED Medications  penicillin g procaine-penicillin g benzathine (BICILLIN-CR) injection 600000-600000 units (has no administration in time range)     Initial Impression / Assessment and Plan / ED Course  I have reviewed the triage vital signs and the nursing notes.  Pertinent labs & imaging results that were available during my care of the patient were reviewed by me and considered in my medical decision making (see chart for details).       Pt febrile with tonsillar exudate, & dysphagia; diagnosis of strep by swab.  She was offered steroids while in the department which she declined.  Offered her prescription for oral outpatient antibiotics or penicillin IM, she requested penicillin IM.  She appears mildly dehydrated, we discussed the importance of water rehydration.  She was tachycardic on initial vitals however I suspect that  this is secondary to her fever.  She is not having any chest pain or shortness of breath.  Instructed on over-the-counter pain medicine.  She is able to drink water without difficulty.  She is no concern for PTA or infection spread to soft tissues.  Her airway is intact, she does not have evidence of trismus or stridor.     Return precautions were discussed with patient who states their understanding.  At the time of discharge patient denied any unaddressed complaints or concerns.  Patient is agreeable for discharge home.   Final Clinical Impressions(s) / ED Diagnoses   Final diagnoses:  Strep pharyngitis    ED Discharge  Orders    None       Norman Clay 01/26/19 2204    Virgina Norfolk, DO 01/26/19 2354

## 2019-01-26 NOTE — ED Triage Notes (Signed)
Pt here for sore throat since yesterday and fever of 101 since 4 pm. No OTC meds PTA. Afebrile but tachy in triage.

## 2019-01-26 NOTE — Discharge Instructions (Addendum)
Today your throat test showed that you have strep throat.  You were given a shot of penicillin.  You are considered contagious until you have had the antibiotics in your system for 24 hours.  Please make sure that you are drinking plenty of fluids and staying well-hydrated. While in the emergency room your blood pressure was high.  Please do not take any medicines that are decongestants as this can raise your blood pressure.  Please follow-up with your primary care doctor in 1 week to get this rechecked.  Please be aware that antibiotics can make your birth control not work well and you should use backup protection.  Please take Ibuprofen (Advil, motrin) and Tylenol (acetaminophen) to relieve your pain.  You may take up to 600 MG (3 pills) of normal strength ibuprofen every 8 hours as needed.  In between doses of ibuprofen you make take tylenol, up to 1,000 mg (two extra strength pills).  Do not take more than 3,000 mg tylenol in a 24 hour period.  Please check all medication labels as many medications such as pain and cold medications may contain tylenol.  Do not drink alcohol while taking these medications.  Do not take other NSAID'S while taking ibuprofen (such as aleve or naproxen).  Please take ibuprofen with food to decrease stomach upset.

## 2019-02-16 ENCOUNTER — Emergency Department (HOSPITAL_COMMUNITY)
Admission: EM | Admit: 2019-02-16 | Discharge: 2019-02-17 | Disposition: A | Discharge status: Home / Self Care | Payer: BLUE CROSS/BLUE SHIELD | Attending: Emergency Medicine | Admitting: Emergency Medicine

## 2019-02-16 ENCOUNTER — Emergency Department (HOSPITAL_COMMUNITY): Payer: BLUE CROSS/BLUE SHIELD

## 2019-02-16 ENCOUNTER — Other Ambulatory Visit: Payer: Self-pay

## 2019-02-16 DIAGNOSIS — N730 Acute parametritis and pelvic cellulitis: Secondary | ICD-10-CM

## 2019-02-16 DIAGNOSIS — Z91040 Latex allergy status: Secondary | ICD-10-CM | POA: Diagnosis not present

## 2019-02-16 DIAGNOSIS — R0602 Shortness of breath: Secondary | ICD-10-CM | POA: Diagnosis present

## 2019-02-16 DIAGNOSIS — J189 Pneumonia, unspecified organism: Secondary | ICD-10-CM | POA: Insufficient documentation

## 2019-02-16 DIAGNOSIS — N739 Female pelvic inflammatory disease, unspecified: Secondary | ICD-10-CM | POA: Insufficient documentation

## 2019-02-16 DIAGNOSIS — F17210 Nicotine dependence, cigarettes, uncomplicated: Secondary | ICD-10-CM | POA: Diagnosis not present

## 2019-02-16 LAB — CBC WITH DIFFERENTIAL/PLATELET
Abs Immature Granulocytes: 0 10*3/uL (ref 0.00–0.07)
Basophils Absolute: 0.1 10*3/uL (ref 0.0–0.1)
Basophils Relative: 1 %
Eosinophils Absolute: 0.2 10*3/uL (ref 0.0–0.5)
Eosinophils Relative: 2 %
HCT: 37.4 % (ref 36.0–46.0)
Hemoglobin: 11.6 g/dL — ABNORMAL LOW (ref 12.0–15.0)
Lymphocytes Relative: 50 %
Lymphs Abs: 5.1 10*3/uL — ABNORMAL HIGH (ref 0.7–4.0)
MCH: 26.7 pg (ref 26.0–34.0)
MCHC: 31 g/dL (ref 30.0–36.0)
MCV: 86.2 fL (ref 80.0–100.0)
Monocytes Absolute: 0.4 10*3/uL (ref 0.1–1.0)
Monocytes Relative: 4 %
Neutro Abs: 4.3 10*3/uL (ref 1.7–7.7)
Neutrophils Relative %: 43 %
Platelets: 261 10*3/uL (ref 150–400)
RBC: 4.34 MIL/uL (ref 3.87–5.11)
RDW: 16.8 % — ABNORMAL HIGH (ref 11.5–15.5)
WBC: 10.1 10*3/uL (ref 4.0–10.5)
nRBC: 0 % (ref 0.0–0.2)
nRBC: 0 /100 WBC

## 2019-02-16 LAB — COMPREHENSIVE METABOLIC PANEL
ALT: 116 U/L — ABNORMAL HIGH (ref 0–44)
AST: 82 U/L — ABNORMAL HIGH (ref 15–41)
Albumin: 3 g/dL — ABNORMAL LOW (ref 3.5–5.0)
Alkaline Phosphatase: 78 U/L (ref 38–126)
Anion gap: 11 (ref 5–15)
BUN: 8 mg/dL (ref 6–20)
CO2: 23 mmol/L (ref 22–32)
Calcium: 8.7 mg/dL — ABNORMAL LOW (ref 8.9–10.3)
Chloride: 102 mmol/L (ref 98–111)
Creatinine, Ser: 0.84 mg/dL (ref 0.44–1.00)
GFR calc Af Amer: >60 mL/min (ref >60)
GFR calc non Af Amer: >60 mL/min (ref >60)
Glucose, Bld: 97 mg/dL (ref 70–99)
Potassium: 4 mmol/L (ref 3.5–5.1)
Sodium: 136 mmol/L (ref 135–145)
Total Bilirubin: 0.7 mg/dL (ref 0.3–1.2)
Total Protein: 6.3 g/dL — ABNORMAL LOW (ref 6.5–8.1)

## 2019-02-16 NOTE — ED Triage Notes (Signed)
Pt here for sob and rest since yesterday. Productive cough x 1 week. Recently had strep and says she hasn't felt good since then.

## 2019-02-17 LAB — URINALYSIS, ROUTINE W REFLEX MICROSCOPIC
Bilirubin Urine: NEGATIVE
Glucose, UA: NEGATIVE mg/dL
Hgb urine dipstick: NEGATIVE
Ketones, ur: NEGATIVE mg/dL
Nitrite: NEGATIVE
Protein, ur: NEGATIVE mg/dL
Specific Gravity, Urine: 1.018 (ref 1.005–1.030)
pH: 5 (ref 5.0–8.0)

## 2019-02-17 LAB — GC/CHLAMYDIA PROBE AMP (~~LOC~~) NOT AT ARMC
Chlamydia: NEGATIVE
Neisseria Gonorrhea: NEGATIVE

## 2019-02-17 LAB — WET PREP, GENITAL
Clue Cells Wet Prep HPF POC: NONE SEEN
Sperm: NONE SEEN
Yeast Wet Prep HPF POC: NONE SEEN

## 2019-02-17 LAB — HIV ANTIBODY (ROUTINE TESTING W REFLEX): HIV Screen 4th Generation wRfx: NONREACTIVE

## 2019-02-17 MED ORDER — AEROCHAMBER PLUS FLO-VU LARGE MISC
1.0000 | Freq: Once | Status: AC
  Administered 2019-02-17: 1

## 2019-02-17 MED ORDER — DOXYCYCLINE HYCLATE 100 MG PO CAPS: 100.0000 mg | ORAL_CAPSULE | Freq: Two times a day (BID) | ORAL | 0 refills | Status: AC

## 2019-02-17 MED ORDER — DOXYCYCLINE HYCLATE 100 MG PO TABS
100.0000 mg | ORAL_TABLET | Freq: Once | ORAL | Status: AC
  Administered 2019-02-17: 100 mg via ORAL
  Filled 2019-02-17: qty 1

## 2019-02-17 MED ORDER — PREDNISONE 20 MG PO TABS: ORAL_TABLET | ORAL | 0 refills | Status: DC

## 2019-02-17 MED ORDER — STERILE WATER FOR INJECTION IJ SOLN
INTRAMUSCULAR | Status: AC
  Administered 2019-02-17: 10 mL
  Filled 2019-02-17: qty 10

## 2019-02-17 MED ORDER — METRONIDAZOLE 500 MG PO TABS
500.0000 mg | ORAL_TABLET | Freq: Once | ORAL | Status: AC
  Administered 2019-02-17: 500 mg via ORAL
  Filled 2019-02-17: qty 1

## 2019-02-17 MED ORDER — CEFTRIAXONE SODIUM 250 MG IJ SOLR
250.0000 mg | Freq: Once | INTRAMUSCULAR | Status: AC
  Administered 2019-02-17: 250 mg via INTRAMUSCULAR
  Filled 2019-02-17: qty 250

## 2019-02-17 MED ORDER — ALBUTEROL SULFATE HFA 108 (90 BASE) MCG/ACT IN AERS
2.0000 | INHALATION_SPRAY | Freq: Once | RESPIRATORY_TRACT | Status: AC
  Administered 2019-02-17: 2 via RESPIRATORY_TRACT
  Filled 2019-02-17: qty 6.7

## 2019-02-17 MED ORDER — METRONIDAZOLE 500 MG PO TABS: 500.0000 mg | ORAL_TABLET | Freq: Two times a day (BID) | ORAL | 0 refills | Status: AC

## 2019-02-17 MED ORDER — METHYLPREDNISOLONE SODIUM SUCC 125 MG IJ SOLR
125.0000 mg | Freq: Once | INTRAMUSCULAR | Status: AC
  Administered 2019-02-17: 125 mg via INTRAVENOUS
  Filled 2019-02-17: qty 2

## 2019-02-17 MED ORDER — LACTATED RINGERS IV BOLUS
1000.0000 mL | Freq: Once | INTRAVENOUS | Status: AC
  Administered 2019-02-17: 1000 mL via INTRAVENOUS

## 2019-02-17 NOTE — ED Notes (Signed)
Pt discharged from ED; instructions provided and scripts given; Pt encouraged to return to ED if symptoms worsen and to f/u with PCP; Pt verbalized understanding of all instructions 

## 2019-02-17 NOTE — ED Provider Notes (Signed)
Emergency Department Provider Note   I have reviewed the triage vital signs and the nursing notes.   HISTORY  Chief Complaint Shortness of Breath   HPI Tracy Vance is a 38 y.o. female w/ a history of smoking, obesity and anxiety who presents to the emergency department today secondary to cough, chest pain and shortness of breath.  Patient apparently was diagnosed with strep throat a couple weeks ago he recovered from that pretty well but then about a week ago she started having productive cough of white sputum.  She had a fever at that time along with some mild right-sided back pain and right lower quadrant pain.  She went to see her doctor and says she had dark urine but negative for infection.  She was told to take antipyretics.  Couple days ago she started having worsening shortness of breath with chest pain anytime she moves.  Better with rest.  She has some anxiety with the tube it is separate.  She smokes.  She has no other history.  States her urine is still dark and still burns when she pees.  She still has some right-sided back pain but seems to be improved compared to last week.  No diffuse abdominal pain.  No other associated or modifying symptoms.    Past Medical History:  Diagnosis Date  . Anxiety   . Bell's palsy during pregnancy, antepartum   . GERD (gastroesophageal reflux disease)    Takes tums PRN  . Morbid obesity (HCC)     There are no active problems to display for this patient.   Past Surgical History:  Procedure Laterality Date  . CESAREAN SECTION    . CHOLECYSTECTOMY    . KNEE SURGERY    . MULTIPLE EXTRACTIONS WITH ALVEOLOPLASTY N/A 10/11/2013   Procedure: MULTIPLE EXTRACTION WITH ALVEOLOPLASTY;  Surgeon: Georgia Lopes, DDS;  Location: [facility] OR;  Service: Oral Surgery;  Laterality: N/A;  . TONSILLECTOMY        Allergies Latex; Naproxen; and Toradol [ketorolac tromethamine]  Family History  Problem Relation Age of Onset  . Hypertension Mother    . Hypertension Father     Social History Social History   Tobacco Use  . Smoking status: Current Every Day Smoker    Packs/day: 0.50    Years: 16.00    Pack years: 8.00    Types: Cigarettes  . Smokeless tobacco: Never Used  Substance Use Topics  . Alcohol use: Yes    Comment: rare  . Drug use: No    Review of Systems  All other systems negative except as documented in the HPI. All pertinent positives and negatives as reviewed in the HPI. ____________________________________________   PHYSICAL EXAM:  VITAL SIGNS: ED Triage Vitals  Enc Vitals Group     BP 02/16/19 1918 (!) 133/92     Pulse Rate 02/16/19 1918 (!) 122     Resp 02/16/19 1918 (!) 24     Temp 02/16/19 1918 98.6 F (37 C)     Temp Source 02/16/19 1918 Oral     SpO2 02/16/19 1918 96 %    Constitutional: Alert and oriented. Well appearing and in no acute distress. Eyes: Conjunctivae are normal. PERRL. EOMI. Head: Atraumatic. Nose: No congestion/rhinnorhea. Mouth/Throat: Mucous membranes are moist.  Oropharynx non-erythematous. Neck: No stridor.  No meningeal signs.   Cardiovascular: Normal rate, regular rhythm. Good peripheral circulation. Grossly normal heart sounds.   Respiratory: Normal respiratory effort.  No retractions. Lungs CTAB. Gastrointestinal: Soft and nontender.  No distention.  GU: chaperoned by nurse tech, no cervicitis, did have purulent discharge with foul odor.  Musculoskeletal: No lower extremity tenderness nor edema. No gross deformities of extremities. Neurologic:  Normal speech and language. No gross focal neurologic deficits are appreciated.  Skin:  Skin is warm, dry and intact. No rash noted.   ____________________________________________   LABS (all labs ordered are listed, but only abnormal results are displayed)  Labs Reviewed  WET PREP, GENITAL - Abnormal; Notable for the following components:      Result Value   Trich, Wet Prep PRESENT (*)    WBC, Wet Prep HPF POC  MANY (*)    All other components within normal limits  CBC WITH DIFFERENTIAL/PLATELET - Abnormal; Notable for the following components:   Hemoglobin 11.6 (*)    RDW 16.8 (*)    Lymphs Abs 5.1 (*)    All other components within normal limits  COMPREHENSIVE METABOLIC PANEL - Abnormal; Notable for the following components:   Calcium 8.7 (*)    Total Protein 6.3 (*)    Albumin 3.0 (*)    AST 82 (*)    ALT 116 (*)    All other components within normal limits  URINALYSIS, ROUTINE W REFLEX MICROSCOPIC - Abnormal; Notable for the following components:   APPearance HAZY (*)    Leukocytes,Ua LARGE (*)    Bacteria, UA RARE (*)    Trichomonas, UA PRESENT (*)    All other components within normal limits  RPR  HIV ANTIBODY (ROUTINE TESTING W REFLEX)  GC/CHLAMYDIA PROBE AMP ([place]) NOT AT [facility]   ____________________________________________  EKG   EKG Interpretation  Date/Time:  Wednesday February 17 2019 00:03:10 EDT Ventricular Rate:  98 PR Interval:    QRS Duration: 101 QT Interval:  341 QTC Calculation: 436 R Axis:   30 Text Interpretation:  Sinus rhythm Low voltage, precordial leads No significant change since last tracing Confirmed by Marily MemosMesner, Tamisha Nordstrom [phone]([id number]) on 02/17/2019 12:11:24 AM       ____________________________________________  RADIOLOGY  Dg Chest 2 View  Result Date: 02/16/2019 CLINICAL DATA:  38 y/o F; shortness of breath and productive cough for 1 day. History of smoking. EXAM: CHEST - 2 VIEW COMPARISON:  07/03/2015 chest radiograph FINDINGS: Stable cardiac silhouette within normal limits given projection and technique. Streaky opacities at the lung bases, possibly bronchitic changes or atelectasis. No pleural effusion or pneumothorax. No acute osseous abnormality is evident. IMPRESSION: Streaky opacities at the lung bases, possibly bronchitic changes or atelectasis. Electronically Signed   By: Mitzi HansenLance  Furusawa-Stratton M.D.   On: 02/16/2019 20:15     ____________________________________________   INITIAL IMPRESSION / ASSESSMENT AND PLAN / ED COURSE  Possibly pneumonia vs bronchitis?  Patient ultimately diagnosed with possible bronchitis but also likely pelvic inflammatory disease.  Patient's breathing improved significantly with albuterol and prednisone so we will continue those at home.  Started on antibiotics to cover for possible community-acquired pneumonia and pelvic inflammatory disease.  GC, syphilis and HIV pending.    Pertinent labs & imaging results that were available during my care of the patient were reviewed by me and considered in my medical decision making (see chart for details).   A medical screening exam was performed and I feel the patient has had an appropriate workup for their chief complaint at this time and likelihood of emergent condition existing is low. They have been counseled on decision, discharge, follow up and which symptoms necessitate immediate return to the emergency department. They or  their family verbally stated understanding and agreement with plan and discharged in stable condition.   ____________________________________________  FINAL CLINICAL IMPRESSION(S) / ED DIAGNOSES  Final diagnoses:  Community acquired pneumonia, unspecified laterality  PID (acute pelvic inflammatory disease)     MEDICATIONS GIVEN DURING THIS VISIT:  Medications  lactated ringers bolus 1,000 mL (0 mLs Intravenous Stopped 02/17/19 0351)  methylPREDNISolone sodium succinate (SOLU-MEDROL) 125 mg/2 mL injection 125 mg (125 mg Intravenous Given 02/17/19 0042)  albuterol (PROVENTIL HFA;VENTOLIN HFA) 108 (90 Base) MCG/ACT inhaler 2 puff (2 puffs Inhalation Given 02/17/19 0046)  AeroChamber Plus Flo-Vu Large MISC 1 each (1 each Other Given 02/17/19 0305)  cefTRIAXone (ROCEPHIN) injection 250 mg (250 mg Intramuscular Given 02/17/19 0355)  doxycycline (VIBRA-TABS) tablet 100 mg (100 mg Oral Given 02/17/19 0355)  metroNIDAZOLE  (FLAGYL) tablet 500 mg (500 mg Oral Given 02/17/19 0355)  sterile water (preservative free) injection (10 mLs  Given 02/17/19 0356)     NEW OUTPATIENT MEDICATIONS STARTED DURING THIS VISIT:  Discharge Medication List as of 02/17/2019  3:30 AM    START taking these medications   Details  doxycycline (VIBRAMYCIN) 100 MG capsule Take 1 capsule (100 mg total) by mouth 2 (two) times daily for 14 days. One po bid x 7 days, Starting Wed 02/17/2019, Until Wed 03/03/2019, Print    metroNIDAZOLE (FLAGYL) 500 MG tablet Take 1 tablet (500 mg total) by mouth 2 (two) times daily for 14 days. One po bid x 7 days, Starting Wed 02/17/2019, Until Wed 03/03/2019, Print    predniSONE (DELTASONE) 20 MG tablet 2 tabs po daily x 4 days, Print        Note:  This note was prepared with assistance of Dragon voice recognition software. Occasional wrong-word or sound-a-like substitutions may have occurred due to the inherent limitations of voice recognition software.   Ahron Hulbert, Barbara Cower, MD 02/17/19 [phone]

## 2019-02-19 LAB — RPR: RPR Ser Ql: NONREACTIVE

## 2019-09-17 ENCOUNTER — Other Ambulatory Visit: Payer: Self-pay | Admitting: Family

## 2019-09-17 ENCOUNTER — Other Ambulatory Visit: Payer: Self-pay | Admitting: Intensive Care

## 2019-09-17 DIAGNOSIS — Z1231 Encounter for screening mammogram for malignant neoplasm of breast: Secondary | ICD-10-CM

## 2020-02-29 ENCOUNTER — Other Ambulatory Visit: Payer: Self-pay

## 2020-02-29 ENCOUNTER — Emergency Department (HOSPITAL_COMMUNITY)
Admission: EM | Admit: 2020-02-29 | Discharge: 2020-02-29 | Disposition: A | Discharge status: Home / Self Care | Payer: Medicaid Other | Attending: Emergency Medicine | Admitting: Emergency Medicine

## 2020-02-29 ENCOUNTER — Emergency Department (HOSPITAL_COMMUNITY): Payer: Medicaid Other

## 2020-02-29 DIAGNOSIS — F17210 Nicotine dependence, cigarettes, uncomplicated: Secondary | ICD-10-CM | POA: Insufficient documentation

## 2020-02-29 DIAGNOSIS — Z91040 Latex allergy status: Secondary | ICD-10-CM | POA: Insufficient documentation

## 2020-02-29 DIAGNOSIS — M7989 Other specified soft tissue disorders: Secondary | ICD-10-CM

## 2020-02-29 DIAGNOSIS — E860 Dehydration: Secondary | ICD-10-CM | POA: Insufficient documentation

## 2020-02-29 DIAGNOSIS — R0789 Other chest pain: Secondary | ICD-10-CM | POA: Diagnosis not present

## 2020-02-29 DIAGNOSIS — R2243 Localized swelling, mass and lump, lower limb, bilateral: Secondary | ICD-10-CM | POA: Insufficient documentation

## 2020-02-29 DIAGNOSIS — Z79899 Other long term (current) drug therapy: Secondary | ICD-10-CM | POA: Diagnosis not present

## 2020-02-29 DIAGNOSIS — R0600 Dyspnea, unspecified: Secondary | ICD-10-CM | POA: Diagnosis not present

## 2020-02-29 LAB — CBC
HCT: 42 % (ref 36.0–46.0)
Hemoglobin: 13.6 g/dL (ref 12.0–15.0)
MCH: 27.3 pg (ref 26.0–34.0)
MCHC: 32.4 g/dL (ref 30.0–36.0)
MCV: 84.3 fL (ref 80.0–100.0)
Platelets: 348 10*3/uL (ref 150–400)
RBC: 4.98 MIL/uL (ref 3.87–5.11)
RDW: 13.2 % (ref 11.5–15.5)
WBC: 10.4 10*3/uL (ref 4.0–10.5)
nRBC: 0 % (ref 0.0–0.2)

## 2020-02-29 LAB — BASIC METABOLIC PANEL
Anion gap: 9 (ref 5–15)
BUN: 13 mg/dL (ref 6–20)
CO2: 26 mmol/L (ref 22–32)
Calcium: 8.9 mg/dL (ref 8.9–10.3)
Chloride: 105 mmol/L (ref 98–111)
Creatinine, Ser: 0.73 mg/dL (ref 0.44–1.00)
GFR calc Af Amer: >60 mL/min (ref >60)
GFR calc non Af Amer: >60 mL/min (ref >60)
Glucose, Bld: 110 mg/dL — ABNORMAL HIGH (ref 70–99)
Potassium: 4.1 mmol/L (ref 3.5–5.1)
Sodium: 140 mmol/L (ref 135–145)

## 2020-02-29 LAB — URINALYSIS, ROUTINE W REFLEX MICROSCOPIC
Bilirubin Urine: NEGATIVE
Glucose, UA: NEGATIVE mg/dL
Hgb urine dipstick: NEGATIVE
Ketones, ur: 5 mg/dL — AB
Nitrite: NEGATIVE
Protein, ur: NEGATIVE mg/dL
Specific Gravity, Urine: 1.017 (ref 1.005–1.030)
pH: 7 (ref 5.0–8.0)

## 2020-02-29 LAB — HEPATIC FUNCTION PANEL
ALT: 19 U/L (ref 0–44)
AST: 19 U/L (ref 15–41)
Albumin: 3.4 g/dL — ABNORMAL LOW (ref 3.5–5.0)
Alkaline Phosphatase: 54 U/L (ref 38–126)
Bilirubin, Direct: 0.1 mg/dL (ref 0.0–0.2)
Indirect Bilirubin: 0.2 mg/dL — ABNORMAL LOW (ref 0.3–0.9)
Total Bilirubin: 0.3 mg/dL (ref 0.3–1.2)
Total Protein: 6.9 g/dL (ref 6.5–8.1)

## 2020-02-29 LAB — D-DIMER, QUANTITATIVE: D-Dimer, Quant: 0.27 ug/mL-FEU (ref 0.00–0.50)

## 2020-02-29 LAB — I-STAT BETA HCG BLOOD, ED (MC, WL, AP ONLY): I-stat hCG, quantitative: <5 m[IU]/mL (ref <5)

## 2020-02-29 LAB — TROPONIN I (HIGH SENSITIVITY)
Troponin I (High Sensitivity): <2 ng/L (ref <18)
Troponin I (High Sensitivity): <2 ng/L (ref <18)

## 2020-02-29 LAB — BRAIN NATRIURETIC PEPTIDE: B Natriuretic Peptide: 79.7 pg/mL (ref 0.0–100.0)

## 2020-02-29 NOTE — Discharge Instructions (Addendum)
As discussed, your evaluation today has been largely reassuring.  But, it is important that you monitor your condition carefully, and do not hesitate to return to the ED if you develop new, or concerning changes in your condition.  Otherwise, please follow-up with your physician for appropriate ongoing care.  In 1 week you should have repeat chemistry panel and urinalysis to ensure that your hydration status and kidney function is normal.

## 2020-02-29 NOTE — ED Triage Notes (Signed)
Patient reports yesterday she woke up and bilateral ankle feet legs were swollen. Patient states she thought she just overdid it this weekend. Woke up today, arms, hands, face swelling. Reports chest pain in back of chest and says her left arm is numb and tingly. Patient states she is having hard time catching her breath. o2 sat 100% room air in triage.

## 2020-02-29 NOTE — ED Provider Notes (Signed)
[facility]-EMERGENCY DEPT Provider Note   CSN: [id number] Arrival date & time: 02/29/20  1250     History No chief complaint on file.   Tracy Vance is a 39 y.o. female.  HPI    Presents with concern of dyspnea, swelling, chest pain. Patient states that she has a history of anxiety, depression, cardiomegaly.  She does not have a history of other cardiac disease, no other medical problems. She knowledges obesity, smoking cigarettes, but states that she had been generally well without appreciable dyspnea until the past day or so.  Now, over that time she has developed dyspnea, bilateral lower extremity swelling which has seemingly progressed from her feet proximally to her distal thighs bilaterally, symmetrically. There is associated dyspnea, and new sternal chest discomfort radiating front and back. Patient denies fever, denies confusion, denies abdominal pain, denies nausea, vomiting, diarrhea, bowel habit changes. She was previously on anxiolytics, but has not taken any of these medications in about 1 month. She has no known history of hypertension. Past Medical History:  Diagnosis Date  . Anxiety   . Bell's palsy during pregnancy, antepartum   . GERD (gastroesophageal reflux disease)    Takes tums PRN  . Morbid obesity (HCC)     There are no problems to display for this patient.   Past Surgical History:  Procedure Laterality Date  . CESAREAN SECTION    . CHOLECYSTECTOMY    . KNEE SURGERY    . MULTIPLE EXTRACTIONS WITH ALVEOLOPLASTY N/A 10/11/2013   Procedure: MULTIPLE EXTRACTION WITH ALVEOLOPLASTY;  Surgeon: Georgia Lopes, DDS;  Location: [facility] OR;  Service: Oral Surgery;  Laterality: N/A;  . TONSILLECTOMY       OB History   No obstetric history on file.     Family History  Problem Relation Age of Onset  . Hypertension Mother   . Hypertension Father     Social History   Tobacco Use  . Smoking status: Current Every Day Smoker   Packs/day: 0.50    Years: 16.00    Pack years: 8.00    Types: Cigarettes  . Smokeless tobacco: Never Used  Substance Use Topics  . Alcohol use: Yes    Comment: rare  . Drug use: No    Home Medications Prior to Admission medications   Medication Sig Start Date End Date Taking? Authorizing Provider  acetaminophen (TYLENOL) 500 MG tablet Take 1,000 mg by mouth every 6 (six) hours as needed for moderate pain.   Yes [provider]  ibuprofen (ADVIL) 200 MG tablet Take 800 mg by mouth as needed for moderate pain.   Yes [provider]  levonorgestrel (MIRENA, 52 MG,) 20 MCG/24HR IUD 1 each by Intrauterine route once.    Yes [provider]    Allergies    Latex, Naproxen, and Toradol [ketorolac tromethamine]  Review of Systems   Review of Systems  Constitutional:       Per HPI, otherwise negative  HENT:       Per HPI, otherwise negative  Respiratory:       Per HPI, otherwise negative  Cardiovascular:       Per HPI, otherwise negative  Gastrointestinal: Negative for vomiting.  Endocrine:       Negative aside from HPI  Genitourinary:       Neg aside from HPI   Musculoskeletal:       Per HPI, otherwise negative  Skin: Negative.   Neurological: Negative for syncope.    Physical Exam  Updated Vital Signs BP 129/74   Pulse 73   Temp 98.6 F (37 C)   Resp 16   Ht 5\' 4"  (1.626 m)   Wt 136.1 kg   LMP  (LMP Unknown)   SpO2 94%   BMI 51.49 kg/m   Physical Exam Vitals and nursing note reviewed.  Constitutional:      General: She is not in acute distress.    Appearance: She is well-developed. She is obese. She is not ill-appearing.  HENT:     Head: Normocephalic and atraumatic.  Eyes:     Conjunctiva/sclera: Conjunctivae normal.  Cardiovascular:     Rate and Rhythm: Normal rate and regular rhythm.  Pulmonary:     Effort: Pulmonary effort is normal. No respiratory distress.     Breath sounds: Normal breath sounds. No stridor.  Abdominal:       General: There is no distension.  Musculoskeletal:     Right lower leg: Edema present.     Left lower leg: Edema present.  Skin:    General: Skin is warm and dry.  Neurological:     Mental Status: She is alert and oriented to person, place, and time.     Cranial Nerves: No cranial nerve deficit.     ED Results / Procedures / Treatments   Labs (all labs ordered are listed, but only abnormal results are displayed) Labs Reviewed  BASIC METABOLIC PANEL - Abnormal; Notable for the following components:      Result Value   Glucose, Bld 110 (*)    All other components within normal limits  URINALYSIS, ROUTINE W REFLEX MICROSCOPIC - Abnormal; Notable for the following components:   APPearance CLOUDY (*)    Ketones, ur 5 (*)    Leukocytes,Ua SMALL (*)    Bacteria, UA RARE (*)    All other components within normal limits  HEPATIC FUNCTION PANEL - Abnormal; Notable for the following components:   Albumin 3.4 (*)    Indirect Bilirubin 0.2 (*)    All other components within normal limits  CBC  BRAIN NATRIURETIC PEPTIDE  D-DIMER, QUANTITATIVE (NOT AT [facility])  I-STAT BETA HCG BLOOD, ED ([facility], [facility], [facility] ONLY)  TROPONIN I (HIGH SENSITIVITY)  TROPONIN I (HIGH SENSITIVITY)    EKG EKG Interpretation  Date/Time:  Tuesday February 29 2020 13:17:30 EDT Ventricular Rate:  93 PR Interval:    QRS Duration: 87 QT Interval:  348 QTC Calculation: 433 R Axis:   4 Text Interpretation: Sinus rhythm 12 Lead; Mason-Likar unremarkable ecg Confirmed by Carmin Muskrat [phone]) on 02/29/2020 3:43:55 PM   Radiology DG Chest 2 View  Result Date: 02/29/2020 CLINICAL DATA:  Chest pain EXAM: CHEST - 2 VIEW COMPARISON:  February 16, 2019 FINDINGS: Lungs are clear. Heart size and pulmonary vascularity are normal. No adenopathy. No pneumothorax. No bone lesions. IMPRESSION: Lungs clear.  Cardiac silhouette within normal limits. Electronically Signed   By: Lowella Grip III M.D.   On: 02/29/2020 13:43     Procedures Procedures (including critical care time)  Medications Ordered in ED Medications - No data to display  ED Course  I have reviewed the triage vital signs and the nursing notes.  Pertinent labs & imaging results that were available during my care of the patient were reviewed by me and considered in my medical decision making (see chart for details).    MDM Rules/Calculators/A&P  7:15 PM Patient accompanied by her mother.  We discussed today's findings, notable for mild dehydration, otherwise reassuring.  BMP is normal, no heart failure 2 troponins normal, no evidence of ischemia, x-ray does not suggest fluid overload status, no pneumonia, urinalysis does not suggest renal dysfunction, nor does chemistry panel. Patient is awake, alert, afebrile.  D-dimer is also negative reassuring for low suspicion of PE, no evidence for dissection.  Patient discharged with instructions to follow-up with primary care for repeat evaluation, labs, urinalysis. She is mildly hypertensive, this may be secondary to whitecoat effect, versus dehydration, will require evaluation with her primary care physician.  Blood pressure improved substantially after ED evaluation. Final Clinical Impression(s) / ED Diagnoses Final diagnoses:  Swelling of lower extremity  Dehydration     Gerhard Munch, MD 02/29/20 [phone]

## 2020-06-29 ENCOUNTER — Encounter (HOSPITAL_COMMUNITY): Payer: Self-pay | Admitting: Emergency Medicine

## 2020-06-29 ENCOUNTER — Emergency Department (HOSPITAL_COMMUNITY)
Admission: EM | Admit: 2020-06-29 | Discharge: 2020-06-29 | Disposition: A | Discharge status: Home / Self Care | Payer: Medicaid Other | Attending: Emergency Medicine | Admitting: Emergency Medicine

## 2020-06-29 ENCOUNTER — Other Ambulatory Visit: Payer: Self-pay

## 2020-06-29 DIAGNOSIS — Z20822 Contact with and (suspected) exposure to covid-19: Secondary | ICD-10-CM | POA: Diagnosis not present

## 2020-06-29 DIAGNOSIS — R519 Headache, unspecified: Secondary | ICD-10-CM | POA: Insufficient documentation

## 2020-06-29 DIAGNOSIS — R197 Diarrhea, unspecified: Secondary | ICD-10-CM | POA: Diagnosis not present

## 2020-06-29 DIAGNOSIS — F17210 Nicotine dependence, cigarettes, uncomplicated: Secondary | ICD-10-CM | POA: Diagnosis not present

## 2020-06-29 DIAGNOSIS — Z79899 Other long term (current) drug therapy: Secondary | ICD-10-CM | POA: Insufficient documentation

## 2020-06-29 DIAGNOSIS — R6889 Other general symptoms and signs: Secondary | ICD-10-CM

## 2020-06-29 DIAGNOSIS — M7918 Myalgia, other site: Secondary | ICD-10-CM | POA: Insufficient documentation

## 2020-06-29 DIAGNOSIS — Z91040 Latex allergy status: Secondary | ICD-10-CM | POA: Insufficient documentation

## 2020-06-29 DIAGNOSIS — R110 Nausea: Secondary | ICD-10-CM

## 2020-06-29 DIAGNOSIS — M791 Myalgia, unspecified site: Secondary | ICD-10-CM

## 2020-06-29 DIAGNOSIS — R6883 Chills (without fever): Secondary | ICD-10-CM | POA: Diagnosis not present

## 2020-06-29 LAB — SARS CORONAVIRUS 2 BY RT PCR (HOSPITAL ORDER, PERFORMED IN ~~LOC~~ HOSPITAL LAB): SARS Coronavirus 2: NEGATIVE

## 2020-06-29 MED ORDER — ONDANSETRON 4 MG PO TBDP
4.0000 mg | ORAL_TABLET | Freq: Three times a day (TID) | ORAL | 0 refills | Status: DC | PRN
Start: 2020-06-29 — End: 2020-07-03

## 2020-06-29 MED ORDER — ONDANSETRON 4 MG PO TBDP: 4.0000 mg | ORAL_TABLET | Freq: Once | ORAL | Status: DC

## 2020-06-29 NOTE — ED Provider Notes (Signed)
[facility]-EMERGENCY DEPT Provider Note   CSN: [id number] Arrival date & time: 06/29/20  2355     History Chief Complaint  Patient presents with  . Headache  . Nausea    Tracy Vance is a 39 y.o. female.   Headache Pain location:  Generalized Quality: aching. Radiates to:  Does not radiate Onset quality:  Gradual Timing:  Intermittent Progression:  Waxing and waning Chronicity:  New Context comment:  Flu like symptoms Relieved by:  NSAIDs and acetaminophen Worsened by:  Nothing Ineffective treatments:  None tried Associated symptoms: diarrhea, myalgias and nausea   Associated symptoms: no abdominal pain, no back pain, no congestion, no cough, no fever, no near-syncope and no vomiting        Past Medical History:  Diagnosis Date  . Anxiety   . Bell's palsy during pregnancy, antepartum   . GERD (gastroesophageal reflux disease)    Takes tums PRN  . Morbid obesity (HCC)     There are no problems to display for this patient.   Past Surgical History:  Procedure Laterality Date  . CESAREAN SECTION    . CHOLECYSTECTOMY    . KNEE SURGERY    . MULTIPLE EXTRACTIONS WITH ALVEOLOPLASTY N/A 10/11/2013   Procedure: MULTIPLE EXTRACTION WITH ALVEOLOPLASTY;  Surgeon: Georgia Lopes, DDS;  Location: [facility] OR;  Service: Oral Surgery;  Laterality: N/A;  . TONSILLECTOMY       OB History   No obstetric history on file.     Family History  Problem Relation Age of Onset  . Hypertension Mother   . Hypertension Father     Social History   Tobacco Use  . Smoking status: Current Every Day Smoker    Packs/day: 0.50    Years: 16.00    Pack years: 8.00    Types: Cigarettes  . Smokeless tobacco: Never Used  Substance Use Topics  . Alcohol use: Yes    Comment: rare  . Drug use: No    Home Medications Prior to Admission medications   Medication Sig Start Date End Date Taking? Authorizing Provider  acetaminophen (TYLENOL) 500 MG tablet Take  1,000 mg by mouth every 6 (six) hours as needed for moderate pain.    [provider]  ibuprofen (ADVIL) 200 MG tablet Take 800 mg by mouth as needed for moderate pain.    [provider]  levonorgestrel (MIRENA, 52 MG,) 20 MCG/24HR IUD 1 each by Intrauterine route once.     [provider]  ondansetron (ZOFRAN ODT) 4 MG disintegrating tablet Take 1 tablet (4 mg total) by mouth every 8 (eight) hours as needed for up to 10 doses for nausea or vomiting. 06/29/20   Sabino Donovan, MD    Allergies    Latex, Naproxen, and Toradol [ketorolac tromethamine]  Review of Systems   Review of Systems  Constitutional: Positive for chills. Negative for fever.  HENT: Negative for congestion and rhinorrhea.   Respiratory: Negative for cough and shortness of breath.   Cardiovascular: Negative for chest pain, palpitations and near-syncope.  Gastrointestinal: Positive for diarrhea and nausea. Negative for abdominal pain and vomiting.  Genitourinary: Negative for difficulty urinating and dysuria.  Musculoskeletal: Positive for myalgias. Negative for arthralgias and back pain.  Skin: Negative for rash and wound.  Neurological: Positive for headaches. Negative for light-headedness.    Physical Exam Updated Vital Signs BP (!) 145/96 (BP Location: Right Arm)   Pulse 95   Temp 98.3 F (36.8 C) (Oral)  Resp 18   Ht 5\' 4"  (1.626 m)   Wt 135.6 kg   SpO2 99%   BMI 51.32 kg/m   Physical Exam Vitals and nursing note reviewed. Exam conducted with a chaperone present.  Constitutional:      General: She is not in acute distress.    Appearance: Normal appearance.  HENT:     Head: Normocephalic and atraumatic.     Nose: No rhinorrhea.  Eyes:     General:        Right eye: No discharge.        Left eye: No discharge.     Conjunctiva/sclera: Conjunctivae normal.  Cardiovascular:     Rate and Rhythm: Normal rate and regular rhythm.  Pulmonary:     Effort: Pulmonary effort is  normal. No respiratory distress.     Breath sounds: No stridor. No wheezing, rhonchi or rales.  Chest:     Chest wall: No tenderness.  Abdominal:     General: Abdomen is flat. There is no distension.     Palpations: Abdomen is soft.     Tenderness: There is no abdominal tenderness.  Musculoskeletal:        General: No tenderness or signs of injury.  Skin:    General: Skin is warm and dry.  Neurological:     General: No focal deficit present.     Mental Status: She is alert. Mental status is at baseline.     Motor: No weakness.     Comments: 5 out of 5 motor strength in all extremities, sensation intact throughout, no dysmetria, no dysdiadochokinesia, no ataxia with ambulation, cranial nerves II through XII intact, alert and oriented to person place and time   Psychiatric:        Mood and Affect: Mood normal.        Behavior: Behavior normal.     ED Results / Procedures / Treatments   Labs (all labs ordered are listed, but only abnormal results are displayed) Labs Reviewed  SARS CORONAVIRUS 2 BY RT PCR (HOSPITAL ORDER, PERFORMED IN [facility])    EKG None  Radiology No results found.  Procedures Procedures (including critical care time)  Medications Ordered in ED Medications  ondansetron (ZOFRAN-ODT) disintegrating tablet 4 mg (has no administration in time range)    ED Course  I have reviewed the triage vital signs and the nursing notes.  Pertinent labs & imaging results that were available during my care of the patient were reviewed by me and considered in my medical decision making (see chart for details).    MDM Rules/Calculators/A&P                          Flulike symptoms to include myalgias diarrhea nausea headache, fatigue chills. Got Covid vaccine, has lack of taste this morning, cannot taste her cigarettes. Clear lung sounds normal vital signs afebrile, well-appearing, normal neurologic exam, symptoms of Covid versus flulike illness. She  will get a Covid test will be pending. Neurologic exam is normal. Mild headache, that she is able to tolerate, she just wants testing. Nausea is controlled with Zofran and she is discharged home with prescription, Tylenol Motrin recommended for headache. Strict return precautions given.  Final Clinical Impression(s) / ED Diagnoses Final diagnoses:  Nausea in adult  Diarrhea in adult patient  Myalgia  Flu-like symptoms    Rx / DC Orders ED Discharge Orders  Ordered    ondansetron (ZOFRAN ODT) 4 MG disintegrating tablet  Every 8 hours PRN        06/29/20 0956           Sabino Donovan, MD 06/29/20 1000

## 2020-06-29 NOTE — Discharge Instructions (Addendum)
You can take 600 mg of ibuprofen every 6 hours, you can take 1000 mg of Tylenol every 6 hours, you can alternate these every 3 or you can take them together.  Your Covid test is pending, you can get the results on my chart, instructions in your discharge paperwork.

## 2020-06-29 NOTE — ED Triage Notes (Signed)
Pt reports headache starting Tuesday progressing to nausea, diarrhea, and light headedness. Pt also reports not being able to taste cigarette this morning. Has taken ibuprofen for headache with no relief.

## 2020-07-03 ENCOUNTER — Encounter (HOSPITAL_COMMUNITY): Payer: Self-pay

## 2020-07-03 ENCOUNTER — Emergency Department (HOSPITAL_COMMUNITY): Payer: Medicaid Other

## 2020-07-03 ENCOUNTER — Emergency Department (HOSPITAL_COMMUNITY)
Admission: EM | Admit: 2020-07-03 | Discharge: 2020-07-03 | Disposition: A | Discharge status: Home / Self Care | Payer: Medicaid Other | Attending: Emergency Medicine | Admitting: Emergency Medicine

## 2020-07-03 ENCOUNTER — Other Ambulatory Visit: Payer: Self-pay

## 2020-07-03 DIAGNOSIS — R6883 Chills (without fever): Secondary | ICD-10-CM | POA: Diagnosis not present

## 2020-07-03 DIAGNOSIS — F17210 Nicotine dependence, cigarettes, uncomplicated: Secondary | ICD-10-CM | POA: Diagnosis not present

## 2020-07-03 DIAGNOSIS — R519 Headache, unspecified: Secondary | ICD-10-CM | POA: Diagnosis not present

## 2020-07-03 DIAGNOSIS — Z20822 Contact with and (suspected) exposure to covid-19: Secondary | ICD-10-CM | POA: Insufficient documentation

## 2020-07-03 DIAGNOSIS — R110 Nausea: Secondary | ICD-10-CM | POA: Diagnosis not present

## 2020-07-03 DIAGNOSIS — Z91040 Latex allergy status: Secondary | ICD-10-CM | POA: Diagnosis not present

## 2020-07-03 DIAGNOSIS — R05 Cough: Secondary | ICD-10-CM | POA: Insufficient documentation

## 2020-07-03 LAB — SARS CORONAVIRUS 2 BY RT PCR (HOSPITAL ORDER, PERFORMED IN ~~LOC~~ HOSPITAL LAB): SARS Coronavirus 2: NEGATIVE

## 2020-07-03 MED ORDER — ONDANSETRON 4 MG PO TBDP
4.0000 mg | ORAL_TABLET | Freq: Three times a day (TID) | ORAL | 0 refills | Status: AC | PRN
Start: 2020-07-03 — End: ?

## 2020-07-03 NOTE — ED Triage Notes (Signed)
Patient c/o covid symptoms- headache, body aches, "lungs hurt" x  4 days. Patient states she tested negative for covid 4 days ago. Patient's significant other is covid +.

## 2020-07-03 NOTE — Discharge Instructions (Signed)
Your covid-19 test is currently pending, you may follow up on the result through MyChart, link below.  Take zofran as needed for nausea.  Stay hydrated.  Take vitamin D, vitamin C, zinc supplementation as it may help fortify your immune system.  If you test positive for COVID-19, please follow up with the [facility] on [address] for further care.

## 2020-07-03 NOTE — ED Notes (Addendum)
Patient ambulated around room with this RN. SpO2 dropped to 92%. Patient reports mild SOB.

## 2020-07-03 NOTE — ED Provider Notes (Signed)
[facility]-EMERGENCY DEPT Provider Note   CSN: [id number] Arrival date & time: 07/03/20  0745     History Chief Complaint  Patient presents with  . possible Covid    Tracy Vance is a 39 y.o. female.  The history is provided by the patient and medical records. No language interpreter was used.     39 year old female with significant history of morbid obesity, anxiety, GERD, tobacco use presenting complaining of Covid symptoms.  Patient states for the past 6 days she has had headache, chills, sneezing, cough, nausea, having loose stools and overall not feeling well.  Was seen 4 days ago for her symptoms, and tested negative for COVID-19.  However her boyfriend with similar symptoms did test positive for COVID-19 during that same visit.  Patient states she has had vaccination for COVID-19 with Pfizer vaccine second dose in May.  She has been using over-the-counter medication at home without relief.  She rates her headache as a throbbing sensation, waxing waning, moderate in severity.  She denies loss of taste or smell.  She denies any second shortness of breath, neck stiffness, rash, chest pain or abdominal pain.  Past Medical History:  Diagnosis Date  . Anxiety   . Bell's palsy during pregnancy, antepartum   . GERD (gastroesophageal reflux disease)    Takes tums PRN  . Morbid obesity (HCC)     There are no problems to display for this patient.   Past Surgical History:  Procedure Laterality Date  . CESAREAN SECTION    . CHOLECYSTECTOMY    . KNEE SURGERY    . MULTIPLE EXTRACTIONS WITH ALVEOLOPLASTY N/A 10/11/2013   Procedure: MULTIPLE EXTRACTION WITH ALVEOLOPLASTY;  Surgeon: Georgia Lopes, DDS;  Location: [facility] OR;  Service: Oral Surgery;  Laterality: N/A;  . TONSILLECTOMY       OB History   No obstetric history on file.     Family History  Problem Relation Age of Onset  . Hypertension Mother   . Hypertension Father     Social History    Tobacco Use  . Smoking status: Current Every Day Smoker    Packs/day: 0.50    Years: 16.00    Pack years: 8.00    Types: Cigarettes  . Smokeless tobacco: Never Used  Vaping Use  . Vaping Use: Never used  Substance Use Topics  . Alcohol use: Yes    Comment: rare  . Drug use: No    Home Medications Prior to Admission medications   Medication Sig Start Date End Date Taking? Authorizing Provider  acetaminophen (TYLENOL) 500 MG tablet Take 1,000 mg by mouth every 6 (six) hours as needed for moderate pain.    [provider]  ibuprofen (ADVIL) 200 MG tablet Take 800 mg by mouth as needed for moderate pain.    [provider]  levonorgestrel (MIRENA, 52 MG,) 20 MCG/24HR IUD 1 each by Intrauterine route once.     [provider]  ondansetron (ZOFRAN ODT) 4 MG disintegrating tablet Take 1 tablet (4 mg total) by mouth every 8 (eight) hours as needed for up to 10 doses for nausea or vomiting. 06/29/20   Sabino Donovan, MD    Allergies    Latex, Naproxen, and Toradol [ketorolac tromethamine]  Review of Systems   Review of Systems  All other systems reviewed and are negative.   Physical Exam Updated Vital Signs BP (!) 169/91 (BP Location: Right Arm)   Pulse 98   Temp 98.1 F (36.7  C) (Oral)   Resp (!) 21   Ht 5\' 4"  (1.626 m)   Wt 135.6 kg   SpO2 98%   BMI 51.32 kg/m   Physical Exam Vitals and nursing note reviewed.  Constitutional:      General: She is not in acute distress.    Appearance: She is well-developed. She is obese.  HENT:     Head: Atraumatic.  Eyes:     Conjunctiva/sclera: Conjunctivae normal.  Cardiovascular:     Rate and Rhythm: Normal rate and regular rhythm.     Pulses: Normal pulses.     Heart sounds: Normal heart sounds.  Pulmonary:     Effort: Pulmonary effort is normal.     Breath sounds: Normal breath sounds.  Abdominal:     Palpations: Abdomen is soft.     Tenderness: There is no abdominal tenderness.   Musculoskeletal:     Cervical back: Neck supple. No rigidity.  Skin:    Findings: No rash.  Neurological:     Mental Status: She is alert and oriented to person, place, and time.     ED Results / Procedures / Treatments   Labs (all labs ordered are listed, but only abnormal results are displayed) Labs Reviewed - No data to display  EKG None  Radiology DG Chest 2 View  Result Date: 07/03/2020 CLINICAL DATA:  Shortness of breath, COVID exposure EXAM: CHEST - 2 VIEW COMPARISON:  02/29/2020 FINDINGS: The heart size and mediastinal contours are within normal limits. No consolidation or edema. No pleural effusion or pneumothorax. The visualized skeletal structures are unremarkable. IMPRESSION: No acute process in the chest. Electronically Signed   By: 03/02/2020 M.D.   On: 07/03/2020 08:25    Procedures Procedures (including critical care time)  Medications Ordered in ED Medications - No data to display  ED Course  I have reviewed the triage vital signs and the nursing notes.  Pertinent labs & imaging results that were available during my care of the patient were reviewed by me and considered in my medical decision making (see chart for details).    MDM Rules/Calculators/A&P                          BP (!) 121/94   Pulse 95   Temp 98.1 F (36.7 C) (Oral)   Resp (!) 24   Ht 5\' 4"  (1.626 m)   Wt 135.6 kg   SpO2 99%   BMI 51.32 kg/m   Final Clinical Impression(s) / ED Diagnoses Final diagnoses:  Suspected COVID-19 virus infection    Rx / DC Orders ED Discharge Orders         Ordered    ondansetron (ZOFRAN ODT) 4 MG disintegrating tablet  Every 8 hours PRN        07/03/20 0907         9:05 AM Patient has been previously vaccinated for COVID-19 with Pfizer vaccine.  She is here with Covid-like symptoms for the past 6 days.  Initial chest x-ray unremarkable.  She has not hypoxic.  She is well-appearing.  Second Covid test obtained today is currently pending.,   Patient discharged home with Covid precaution and return precaution.  Low suspicion for meningitis or other acute life-threatening emergencies.  Bani Gianfrancesco was evaluated in Emergency Department on 07/03/2020 for the symptoms described in the history of present illness. She was evaluated in the context of the global COVID-19 pandemic, which necessitated consideration that the  patient might be at risk for infection with the SARS-CoV-2 virus that causes COVID-19. Institutional protocols and algorithms that pertain to the evaluation of patients at risk for COVID-19 are in a state of rapid change based on information released by regulatory bodies including the CDC and federal and state organizations. These policies and algorithms were followed during the patient's care in the ED.    Fayrene Helper, PA-C 07/03/20 5035    Lorre Nick, MD 07/04/20 [phone]

## 2021-06-26 IMAGING — CR DG CHEST 2V
2 series · 2 of 2 positions shown · non-contrast
Comparison: February 16, 2019

CLINICAL DATA: Chest pain

EXAM:
CHEST - 2 VIEW

[w chest pa]
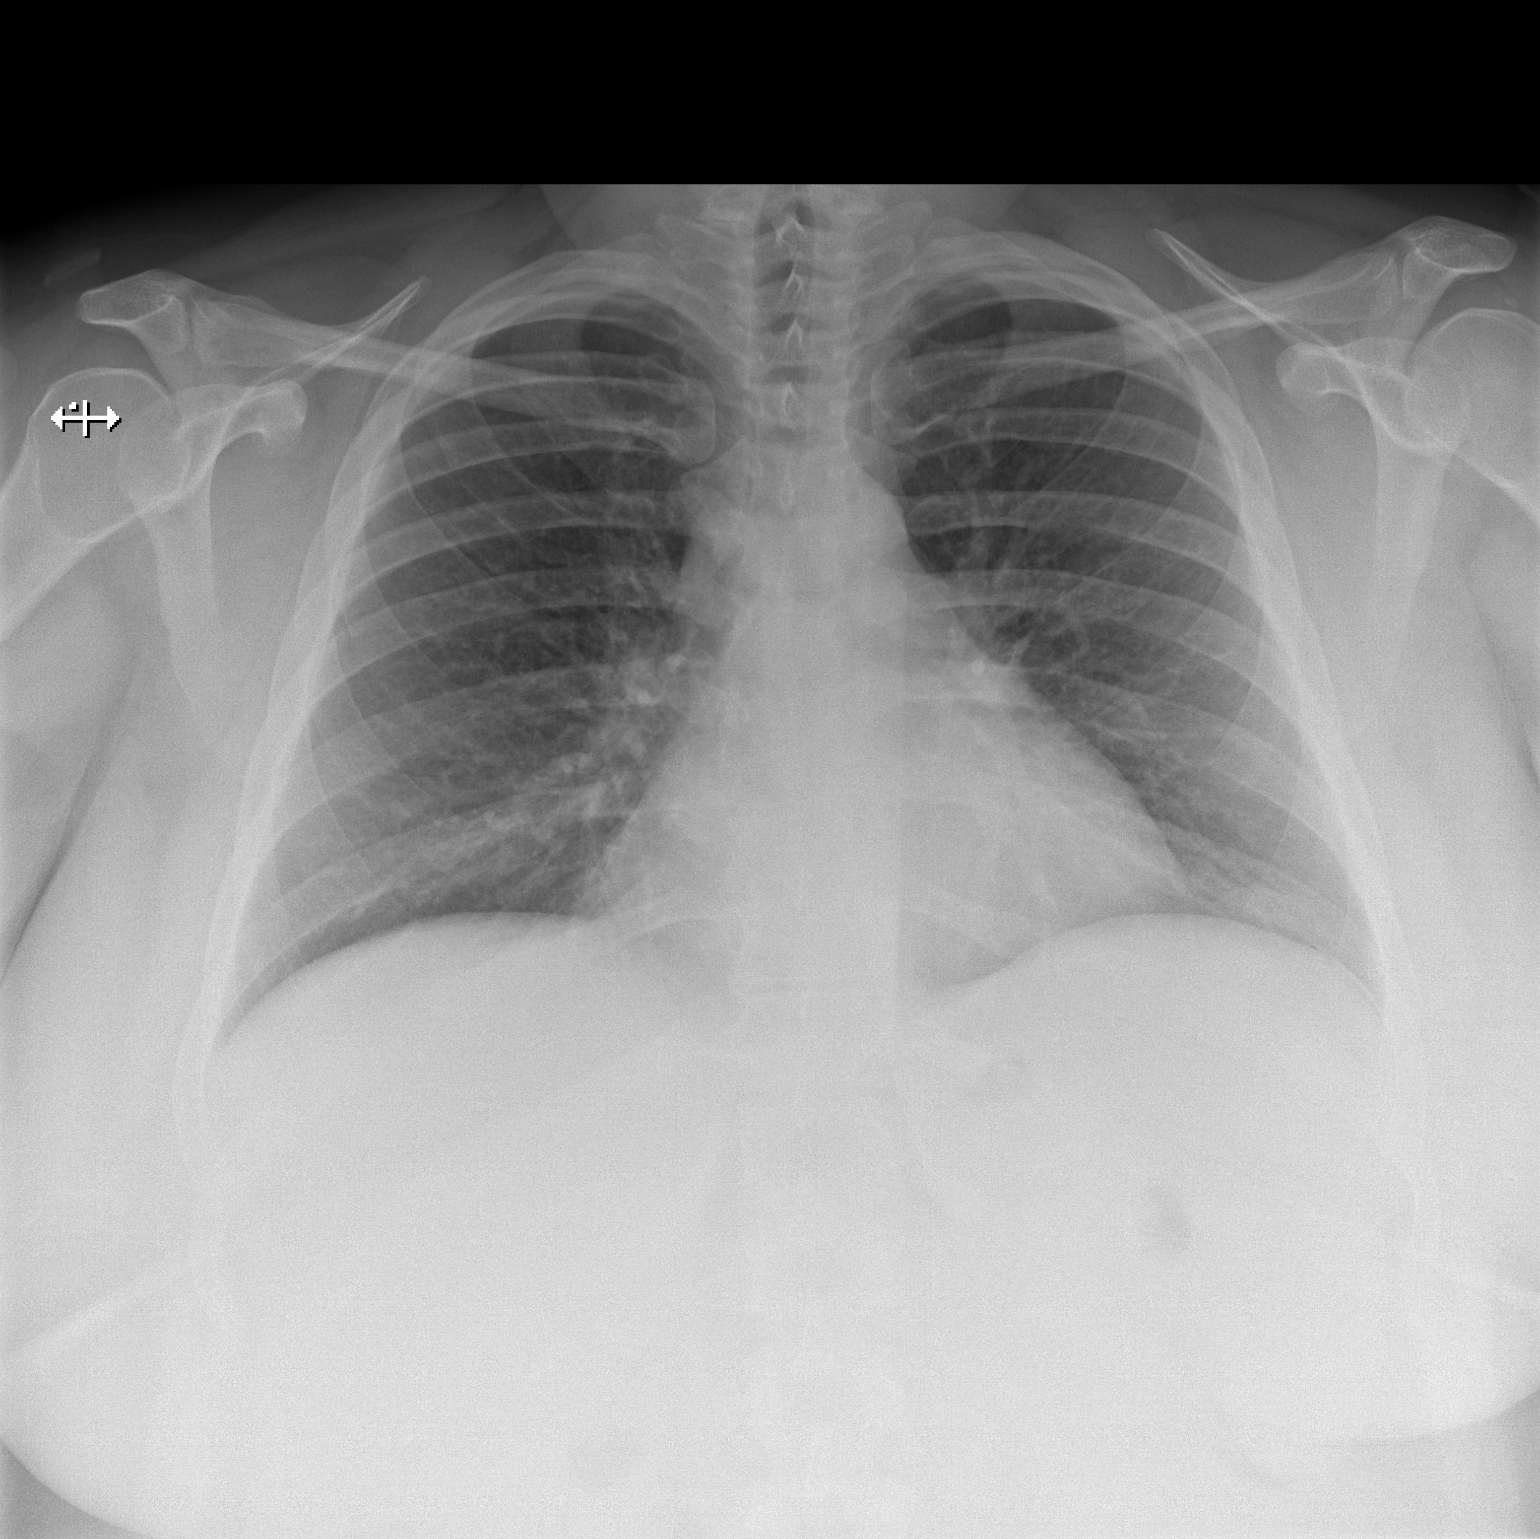

[w chest lat]
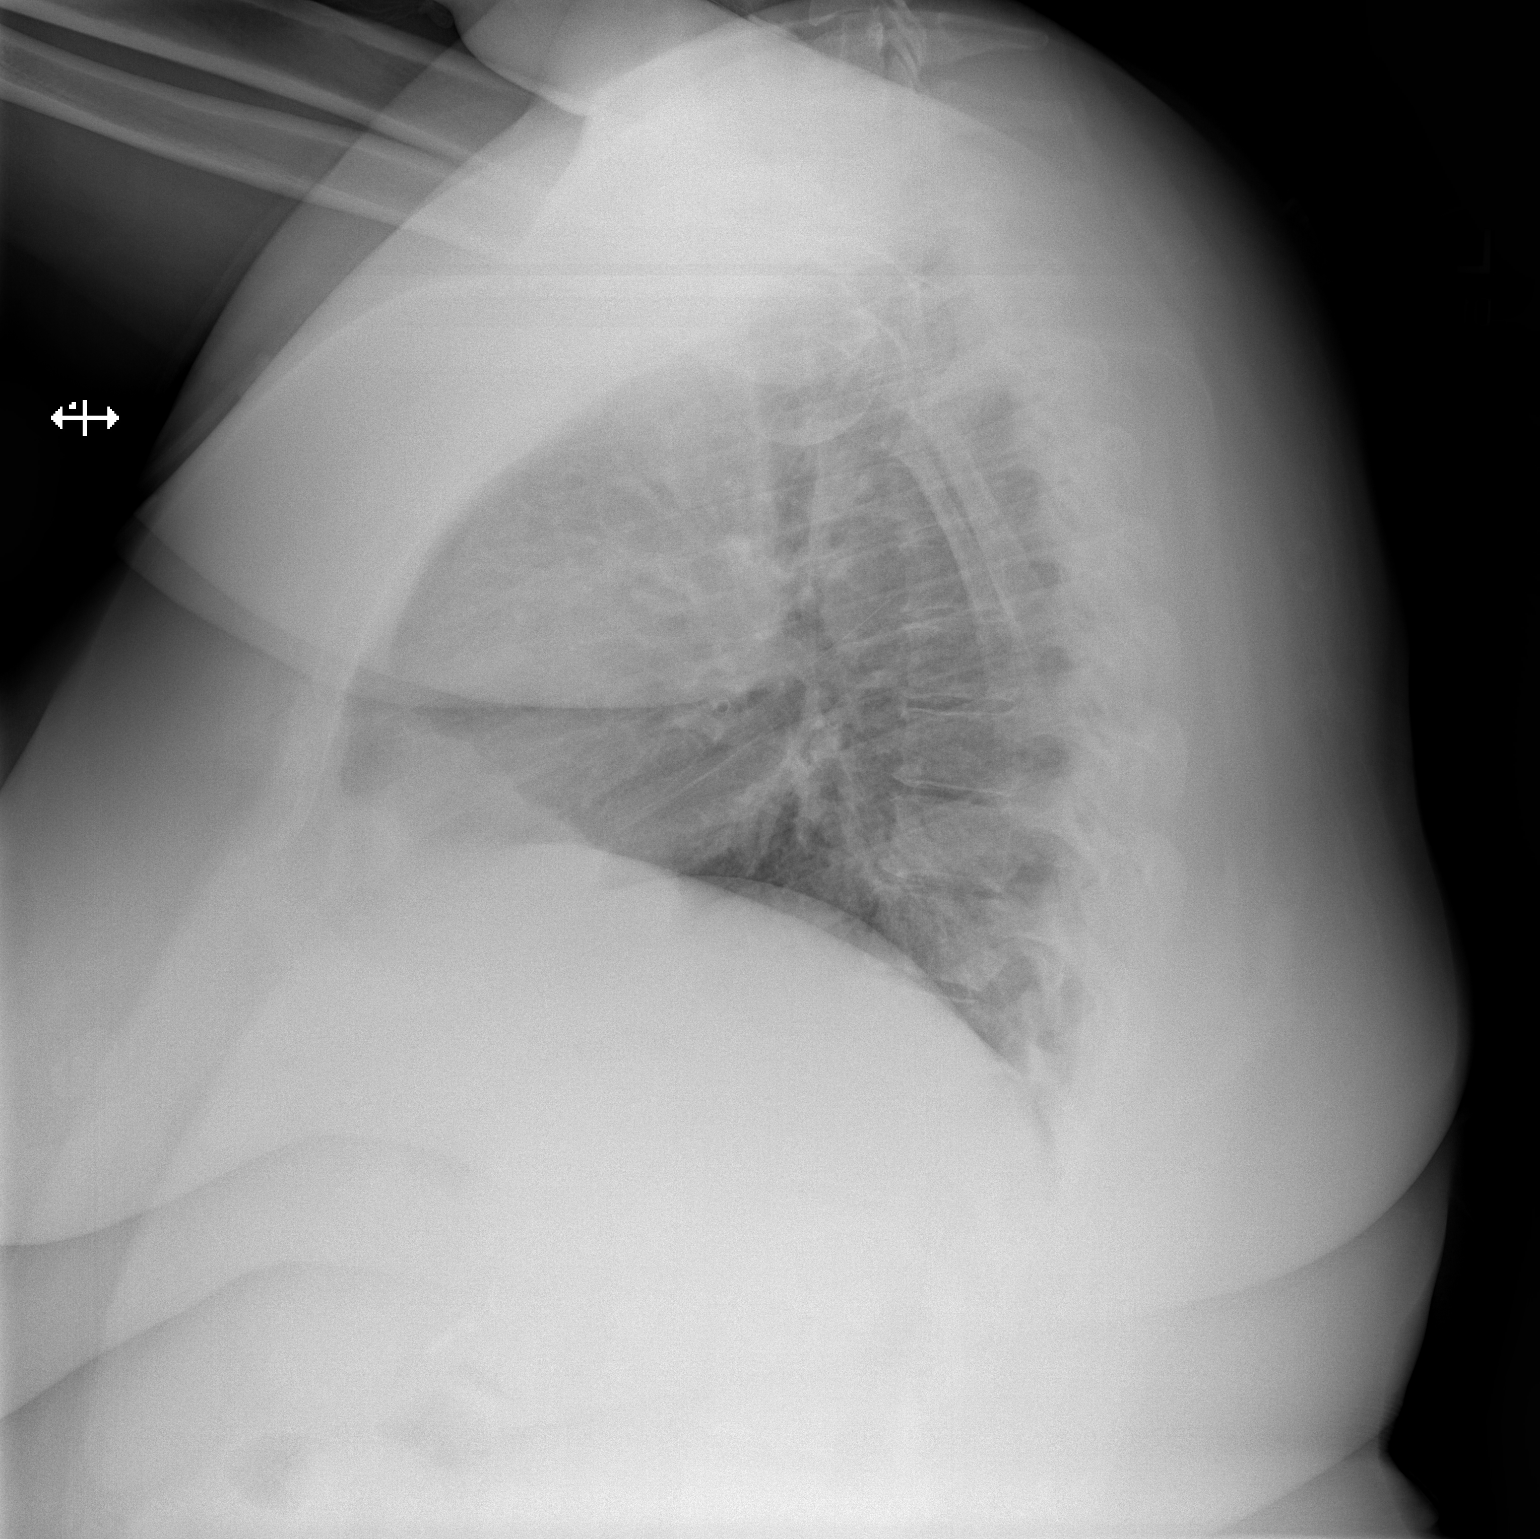

[2 of 2 positions shown; findings below may reference images not displayed]

FINDINGS: Lungs are clear. Heart size and pulmonary vascularity are normal. No
adenopathy. No pneumothorax. No bone lesions.
IMPRESSION: Lungs clear.  Cardiac silhouette within normal limits.

## 2021-10-29 IMAGING — CR DG CHEST 2V
2 series · 2 of 2 positions shown · non-contrast
Comparison: 02/29/2020

CLINICAL DATA: Shortness of breath, COVID exposure

EXAM:
CHEST - 2 VIEW

[w chest pa]
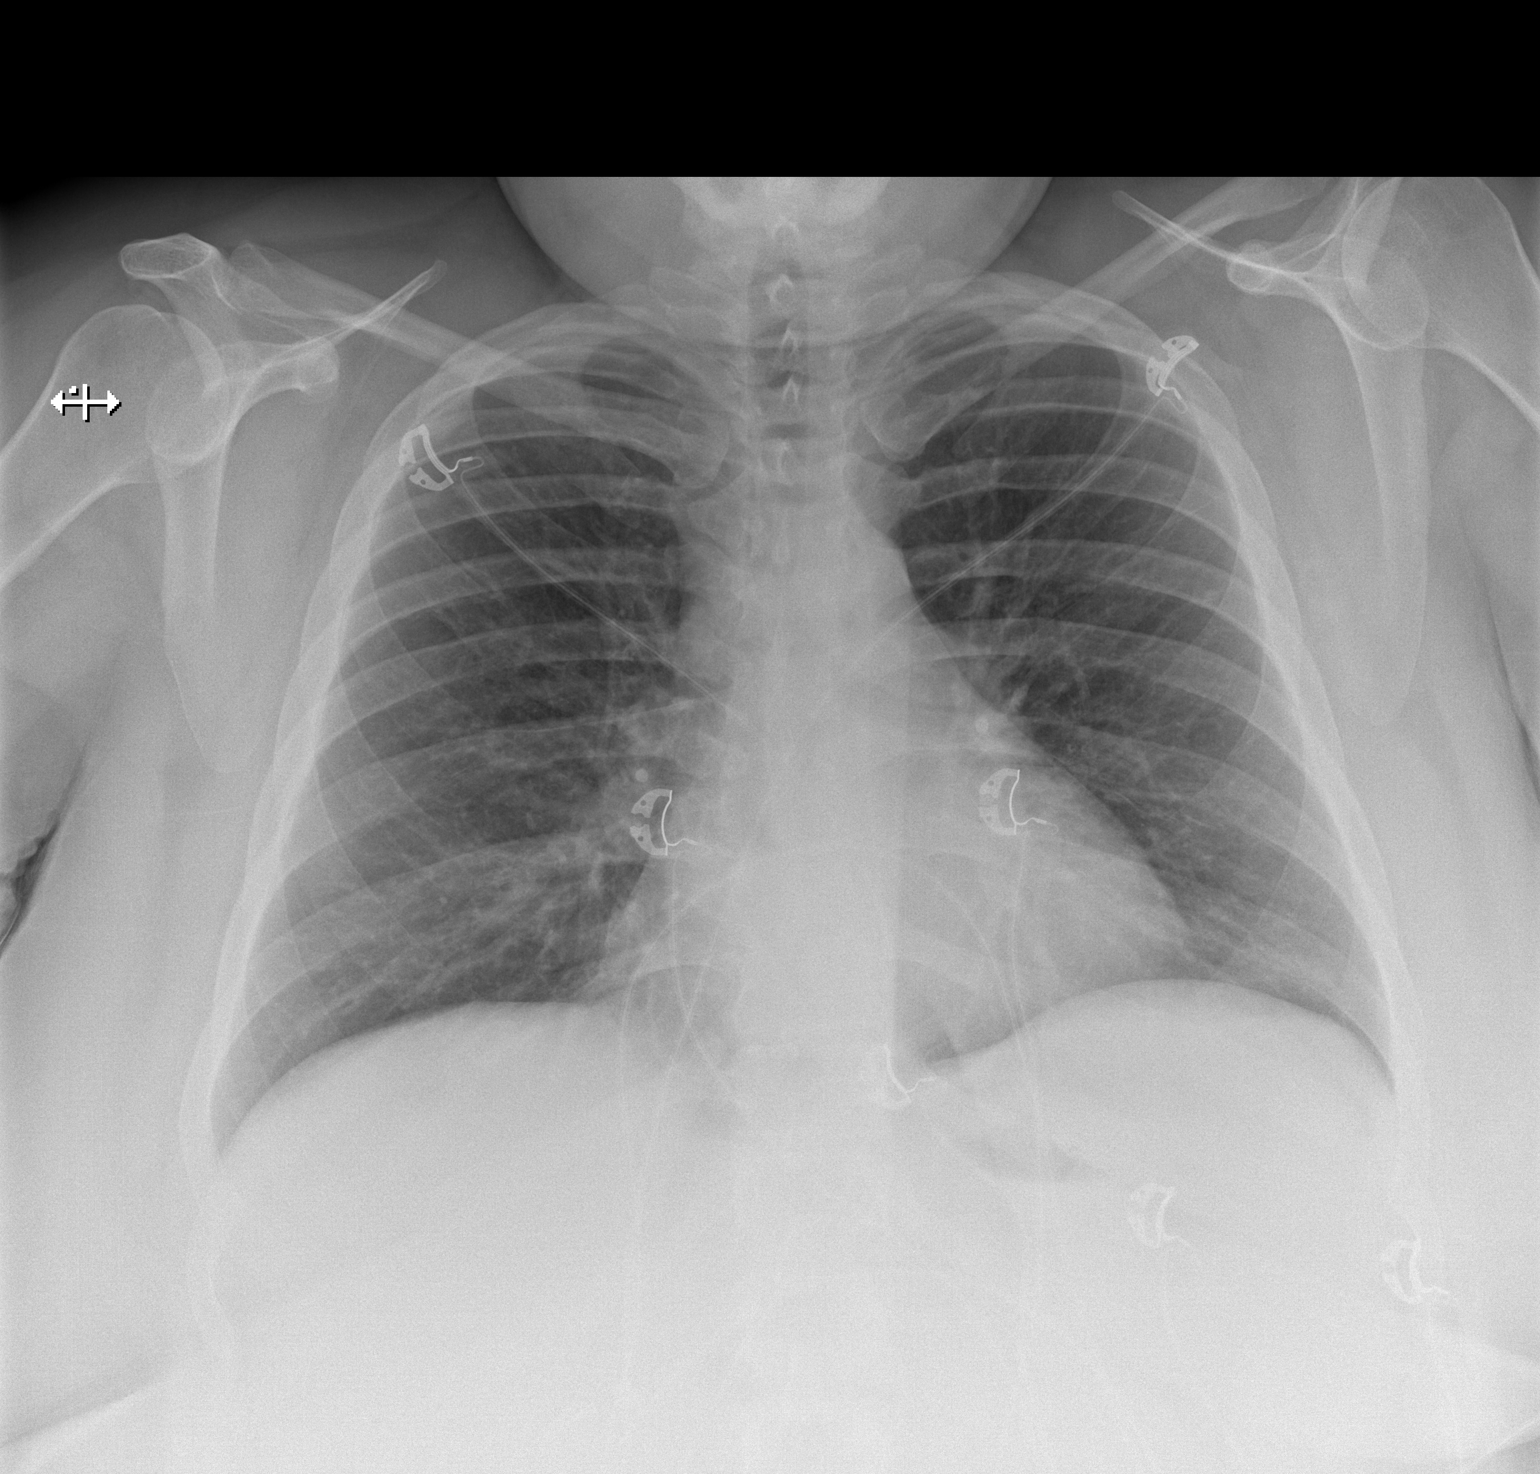

[w chest lat]
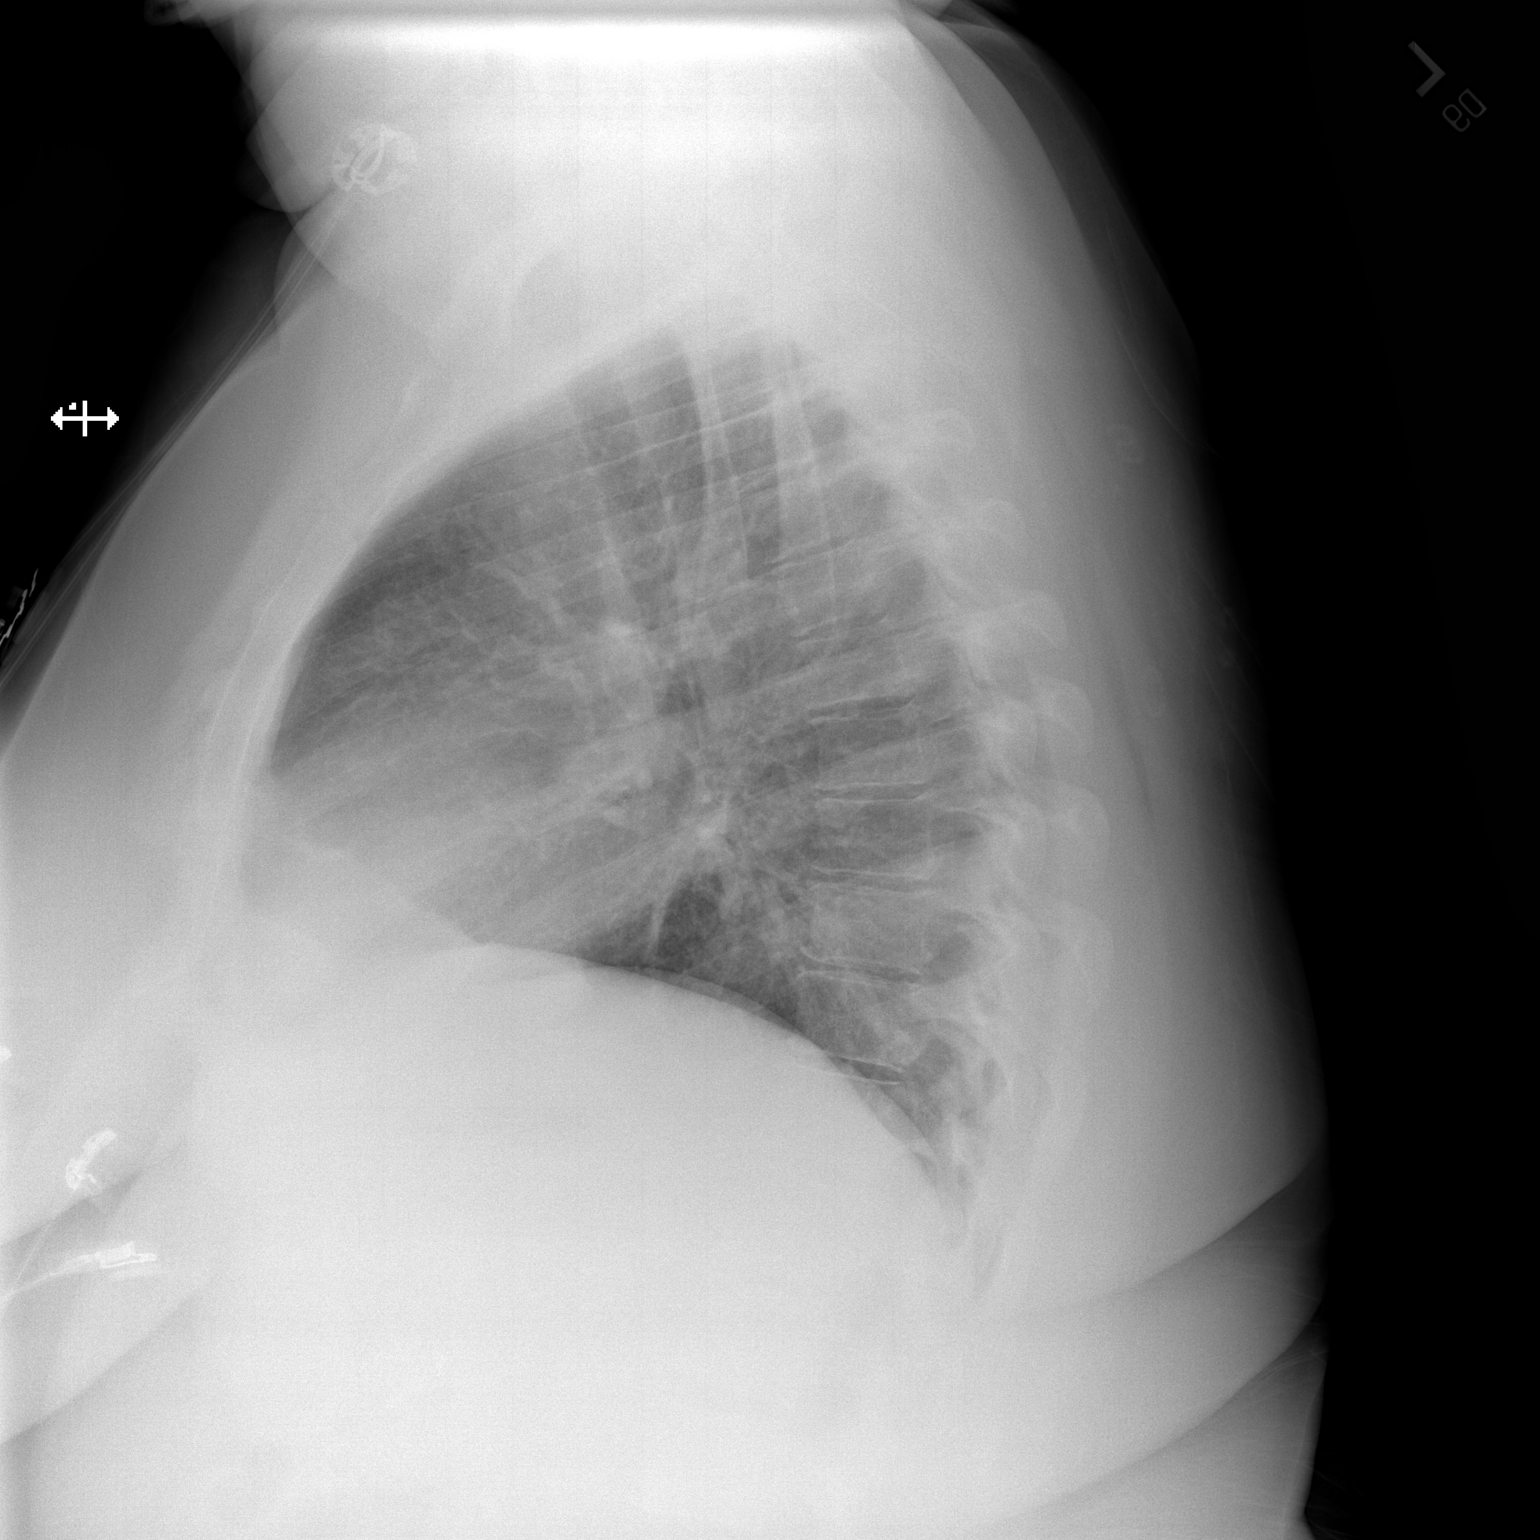

[2 of 2 positions shown; findings below may reference images not displayed]

FINDINGS: The heart size and mediastinal contours are within normal limits. No
consolidation or edema. No pleural effusion or pneumothorax. The
visualized skeletal structures are unremarkable.
IMPRESSION: No acute process in the chest.
